# Patient Record
Sex: Female | Born: 1935 | Race: White | Hispanic: No | Marital: Married | Smoking: Never smoker
Health system: Southern US, Community
[De-identification: ages and names within clinical notes are randomized; demographics above are authoritative.]

## PROBLEM LIST (undated history)

## (undated) DIAGNOSIS — M329 Systemic lupus erythematosus, unspecified: Secondary | ICD-10-CM

## (undated) DIAGNOSIS — I10 Essential (primary) hypertension: Secondary | ICD-10-CM

## (undated) DIAGNOSIS — E785 Hyperlipidemia, unspecified: Secondary | ICD-10-CM

## (undated) DIAGNOSIS — I82409 Acute embolism and thrombosis of unspecified deep veins of unspecified lower extremity: Secondary | ICD-10-CM

## (undated) HISTORY — PX: CHOLECYSTECTOMY: SHX55

## (undated) HISTORY — PX: ABDOMINAL HYSTERECTOMY: SHX81

---

## 1999-06-19 ENCOUNTER — Encounter: Admission: RE | Admit: 1999-06-19 | Discharge: 1999-06-19 | Payer: Self-pay | Admitting: Orthopedic Surgery

## 1999-06-19 ENCOUNTER — Encounter: Payer: Self-pay | Admitting: Orthopedic Surgery

## 1999-06-23 ENCOUNTER — Encounter: Payer: Self-pay | Admitting: Orthopaedic Surgery

## 1999-06-23 ENCOUNTER — Encounter: Admission: RE | Admit: 1999-06-23 | Discharge: 1999-06-23 | Payer: Self-pay | Admitting: Orthopaedic Surgery

## 1999-06-24 ENCOUNTER — Ambulatory Visit (HOSPITAL_BASED_OUTPATIENT_CLINIC_OR_DEPARTMENT_OTHER): Admission: RE | Admit: 1999-06-24 | Discharge: 1999-06-24 | Payer: Self-pay | Admitting: Orthopaedic Surgery

## 1999-07-01 ENCOUNTER — Encounter: Admission: RE | Admit: 1999-07-01 | Discharge: 1999-07-01 | Payer: Self-pay | Admitting: Orthopaedic Surgery

## 1999-07-01 ENCOUNTER — Encounter: Payer: Self-pay | Admitting: Orthopaedic Surgery

## 1999-12-24 ENCOUNTER — Encounter: Admission: RE | Admit: 1999-12-24 | Discharge: 1999-12-24 | Payer: Self-pay | Admitting: Family Medicine

## 1999-12-24 ENCOUNTER — Encounter: Payer: Self-pay | Admitting: Family Medicine

## 2015-04-13 DIAGNOSIS — Z79899 Other long term (current) drug therapy: Secondary | ICD-10-CM | POA: Insufficient documentation

## 2015-04-13 DIAGNOSIS — M818 Other osteoporosis without current pathological fracture: Secondary | ICD-10-CM | POA: Insufficient documentation

## 2015-07-24 DIAGNOSIS — R059 Cough, unspecified: Secondary | ICD-10-CM | POA: Insufficient documentation

## 2015-08-28 DIAGNOSIS — R911 Solitary pulmonary nodule: Secondary | ICD-10-CM | POA: Insufficient documentation

## 2015-08-28 DIAGNOSIS — J0101 Acute recurrent maxillary sinusitis: Secondary | ICD-10-CM | POA: Insufficient documentation

## 2015-09-17 DIAGNOSIS — M7551 Bursitis of right shoulder: Secondary | ICD-10-CM | POA: Insufficient documentation

## 2015-09-17 DIAGNOSIS — M7061 Trochanteric bursitis, right hip: Secondary | ICD-10-CM | POA: Insufficient documentation

## 2016-04-04 DIAGNOSIS — I341 Nonrheumatic mitral (valve) prolapse: Secondary | ICD-10-CM

## 2016-04-04 DIAGNOSIS — E860 Dehydration: Secondary | ICD-10-CM | POA: Insufficient documentation

## 2016-04-04 HISTORY — DX: Nonrheumatic mitral (valve) prolapse: I34.1

## 2016-04-05 DIAGNOSIS — N179 Acute kidney failure, unspecified: Secondary | ICD-10-CM

## 2016-04-05 HISTORY — DX: Acute kidney failure, unspecified: N17.9

## 2016-05-12 DIAGNOSIS — J9601 Acute respiratory failure with hypoxia: Secondary | ICD-10-CM

## 2016-05-12 DIAGNOSIS — Z66 Do not resuscitate: Secondary | ICD-10-CM | POA: Insufficient documentation

## 2016-05-12 DIAGNOSIS — B349 Viral infection, unspecified: Secondary | ICD-10-CM | POA: Insufficient documentation

## 2016-05-12 DIAGNOSIS — B3789 Other sites of candidiasis: Secondary | ICD-10-CM | POA: Insufficient documentation

## 2016-05-12 DIAGNOSIS — K146 Glossodynia: Secondary | ICD-10-CM | POA: Insufficient documentation

## 2016-05-12 DIAGNOSIS — D849 Immunodeficiency, unspecified: Secondary | ICD-10-CM

## 2016-05-12 DIAGNOSIS — Z79899 Other long term (current) drug therapy: Secondary | ICD-10-CM | POA: Insufficient documentation

## 2016-05-12 DIAGNOSIS — Z79631 Long term (current) use of antimetabolite agent: Secondary | ICD-10-CM | POA: Insufficient documentation

## 2016-05-12 DIAGNOSIS — J101 Influenza due to other identified influenza virus with other respiratory manifestations: Secondary | ICD-10-CM | POA: Insufficient documentation

## 2016-05-12 HISTORY — DX: Acute respiratory failure with hypoxia: J96.01

## 2016-05-12 HISTORY — DX: Immunodeficiency, unspecified: D84.9

## 2016-11-02 DIAGNOSIS — L84 Corns and callosities: Secondary | ICD-10-CM | POA: Insufficient documentation

## 2016-11-02 DIAGNOSIS — L603 Nail dystrophy: Secondary | ICD-10-CM | POA: Insufficient documentation

## 2016-11-02 DIAGNOSIS — M2041 Other hammer toe(s) (acquired), right foot: Secondary | ICD-10-CM | POA: Insufficient documentation

## 2018-03-23 ENCOUNTER — Encounter (HOSPITAL_COMMUNITY): Payer: Self-pay | Admitting: Emergency Medicine

## 2018-03-23 ENCOUNTER — Emergency Department (HOSPITAL_COMMUNITY)
Admission: EM | Admit: 2018-03-23 | Discharge: 2018-03-23 | Disposition: A | Discharge status: Home / Self Care | Payer: Medicare Other | Attending: Emergency Medicine | Admitting: Emergency Medicine

## 2018-03-23 DIAGNOSIS — W19XXXA Unspecified fall, initial encounter: Secondary | ICD-10-CM | POA: Insufficient documentation

## 2018-03-23 DIAGNOSIS — D6489 Other specified anemias: Secondary | ICD-10-CM | POA: Diagnosis not present

## 2018-03-23 DIAGNOSIS — Z79899 Other long term (current) drug therapy: Secondary | ICD-10-CM | POA: Diagnosis not present

## 2018-03-23 DIAGNOSIS — Z7901 Long term (current) use of anticoagulants: Secondary | ICD-10-CM | POA: Diagnosis not present

## 2018-03-23 DIAGNOSIS — Z7982 Long term (current) use of aspirin: Secondary | ICD-10-CM | POA: Insufficient documentation

## 2018-03-23 DIAGNOSIS — I10 Essential (primary) hypertension: Secondary | ICD-10-CM | POA: Insufficient documentation

## 2018-03-23 DIAGNOSIS — S40012A Contusion of left shoulder, initial encounter: Secondary | ICD-10-CM | POA: Diagnosis not present

## 2018-03-23 DIAGNOSIS — R799 Abnormal finding of blood chemistry, unspecified: Secondary | ICD-10-CM | POA: Insufficient documentation

## 2018-03-23 DIAGNOSIS — T148XXA Other injury of unspecified body region, initial encounter: Secondary | ICD-10-CM

## 2018-03-23 DIAGNOSIS — Y929 Unspecified place or not applicable: Secondary | ICD-10-CM | POA: Insufficient documentation

## 2018-03-23 DIAGNOSIS — Y999 Unspecified external cause status: Secondary | ICD-10-CM | POA: Diagnosis not present

## 2018-03-23 DIAGNOSIS — Y939 Activity, unspecified: Secondary | ICD-10-CM | POA: Diagnosis not present

## 2018-03-23 DIAGNOSIS — S4992XA Unspecified injury of left shoulder and upper arm, initial encounter: Secondary | ICD-10-CM | POA: Diagnosis present

## 2018-03-23 HISTORY — DX: Acute embolism and thrombosis of unspecified deep veins of unspecified lower extremity: I82.409

## 2018-03-23 HISTORY — DX: Systemic lupus erythematosus, unspecified: M32.9

## 2018-03-23 HISTORY — DX: Essential (primary) hypertension: I10

## 2018-03-23 HISTORY — DX: Hyperlipidemia, unspecified: E78.5

## 2018-03-23 LAB — CBC
HEMATOCRIT: 28 % — AB (ref 36.0–46.0)
Hemoglobin: 8.4 g/dL — ABNORMAL LOW (ref 12.0–15.0)
MCH: 28.8 pg (ref 26.0–34.0)
MCHC: 30 g/dL (ref 30.0–36.0)
MCV: 95.9 fL (ref 80.0–100.0)
Platelets: 281 10*3/uL (ref 150–400)
RBC: 2.92 MIL/uL — ABNORMAL LOW (ref 3.87–5.11)
RDW: 16.3 % — ABNORMAL HIGH (ref 11.5–15.5)
WBC: 9.2 10*3/uL (ref 4.0–10.5)
nRBC: 0 % (ref 0.0–0.2)

## 2018-03-23 LAB — COMPREHENSIVE METABOLIC PANEL
ALT: 18 U/L (ref 0–44)
AST: 25 U/L (ref 15–41)
Albumin: 3.7 g/dL (ref 3.5–5.0)
Alkaline Phosphatase: 40 U/L (ref 38–126)
Anion gap: 9 (ref 5–15)
BUN: 29 mg/dL — AB (ref 8–23)
CO2: 25 mmol/L (ref 22–32)
Calcium: 9.1 mg/dL (ref 8.9–10.3)
Chloride: 108 mmol/L (ref 98–111)
Creatinine, Ser: 1.02 mg/dL — ABNORMAL HIGH (ref 0.44–1.00)
GFR calc Af Amer: 56 mL/min — ABNORMAL LOW (ref >60)
GFR calc non Af Amer: 49 mL/min — ABNORMAL LOW (ref >60)
Glucose, Bld: 100 mg/dL — ABNORMAL HIGH (ref 70–99)
Potassium: 3.9 mmol/L (ref 3.5–5.1)
Sodium: 142 mmol/L (ref 135–145)
Total Bilirubin: 1 mg/dL (ref 0.3–1.2)
Total Protein: 6.3 g/dL — ABNORMAL LOW (ref 6.5–8.1)

## 2018-03-23 LAB — POC OCCULT BLOOD, ED: Fecal Occult Bld: NEGATIVE

## 2018-03-23 NOTE — Discharge Instructions (Addendum)
1.  Increase your intake of foods containing high iron.  You may take an over-the-counter iron supplement. 2.  Elevate your arm as much as possible. 3.  Return to the emergency department if you are feeling lightheaded, increasing shortness of breath, signs of rebleeding or other concerning symptoms.

## 2018-03-23 NOTE — ED Provider Notes (Signed)
[facility] DEPT Provider Note   CSN: [id number] Arrival date & time: 03/23/18  1459     History   Chief Complaint Chief Complaint  Patient presents with  . low HGB    HPI Renee Alvarado is a 85 y.o. female.  HPI Patient is chronically on Eliquis for history of DVT.  She fell last week and bruised her left anterior shoulder.  She reports she had a large bruised area and this continued to expand such that she got a lot of bruising and discoloration in her breasts as well as her arm.  She reports she did follow-up with orthopedics and had x-rays done.  Nothing was broken.  She reports that the pain is still in the shoulder and despite all of the discoloration of her breast and arm, there is no pain involved there.  She reports she was sent by her doctor because her hemoglobin had been 8.5 and then on recheck was 8.2.  Patient denies she is having syncope or near syncope episodes.  Patient has been taking Eliquis ongoing. Past Medical History:  Diagnosis Date  . DVT (deep venous thrombosis) ([place])   . Hyperlipidemia   . Hypertension   . Systemic lupus ([name])     There are no active problems to display for this patient.   Past Surgical History:  Procedure Laterality Date  . ABDOMINAL HYSTERECTOMY    . CHOLECYSTECTOMY       OB History   No obstetric history on file.      Home Medications    Prior to Admission medications   Medication Sig Start Date End Date Taking? Authorizing Provider  apixaban (ELIQUIS) 5 MG TABS tablet Take 5 mg by mouth 2 (two) times daily.   Yes [provider]  aspirin EC 81 MG tablet Take 81 mg by mouth daily.   Yes [provider]  CALCIUM PO Take 1,200 mg by mouth daily.   Yes [provider]  clotrimazole-betamethasone (LOTRISONE) cream Apply 1 application topically 2 (two) times daily.   Yes [provider]  Cyanocobalamin (VITAMIN B-12 IJ) Inject 1,000 mcg as directed every 30  (thirty) days.   Yes [provider]  esomeprazole (NEXIUM) 20 MG capsule Take 20 mg by mouth daily at 12 noon.   Yes [provider]  folic acid (FOLVITE) 1 MG tablet Take 1 mg by mouth 2 (two) times daily.   Yes [provider]  Magnesium 250 MG TABS Take 1 tablet by mouth daily.   Yes [provider]  methotrexate (RHEUMATREX) 5 MG tablet Take 5 mg by mouth once a week. Caution: Chemotherapy. Protect from light.   Yes [provider]  oxybutynin (DITROPAN) 5 MG tablet Take 5 mg by mouth 2 (two) times daily.   Yes [provider]  predniSONE (DELTASONE) 10 MG tablet Take 10 mg by mouth daily with breakfast.   Yes [provider]  propranolol (INDERAL) 10 MG tablet Take 10 mg by mouth 4 (four) times daily.   Yes [provider]  sertraline (ZOLOFT) 50 MG tablet Take 50 mg by mouth daily.   Yes [provider]    Family History No family history on file.  Social History Social History   Tobacco Use  . Smoking status: Never Smoker  . Smokeless tobacco: Never Used  Substance Use Topics  . Alcohol use: Not on file  . Drug use: Not on file     Allergies  Levaquin [levofloxacin]   Review of Systems Review of Systems 10 Systems reviewed and are negative for acute change except as noted in the HPI.   Physical Exam Updated Vital Signs BP 102/88   Pulse 62   Temp 98.4 F (36.9 C) (Oral)   Resp 19   SpO2 98%   Physical Exam Constitutional:      Comments: Patient is alert and well in appearance.  No respiratory distress.  Color good  HENT:     Head: Normocephalic and atraumatic.  Eyes:     Extraocular Movements: Extraocular movements intact.  Cardiovascular:     Rate and Rhythm: Normal rate and regular rhythm.  Pulmonary:     Effort: Pulmonary effort is normal.     Breath sounds: Normal breath sounds.  Chest:     Chest wall: No tenderness.  Abdominal:     General: There is no distension.       Palpations: Abdomen is soft.     Tenderness: There is no abdominal tenderness. There is no guarding.  Musculoskeletal:     Comments: Patient still has pain with range of motion at the left shoulder.  She has discoloration from tachycardia hematoma involving all of the lower aspect of the left arm.  She is however neurovascularly intact with good pulse and good grip.  Sensation normal.  Patient has extensive tracking hematoma in the left breast which is nontender.  Skin:    General: Skin is warm and dry.  Neurological:     General: No focal deficit present.     Coordination: Coordination normal.  Psychiatric:        Mood and Affect: Mood normal.          ED Treatments / Results  Labs (all labs ordered are listed, but only abnormal results are displayed) Labs Reviewed  COMPREHENSIVE METABOLIC PANEL - Abnormal; Notable for the following components:      Result Value   Glucose, Bld 100 (*)    BUN 29 (*)    Creatinine, Ser 1.02 (*)    Total Protein 6.3 (*)    GFR calc non Af Amer 49 (*)    GFR calc Af Amer 56 (*)    All other components within normal limits  CBC - Abnormal; Notable for the following components:   RBC 2.92 (*)    Hemoglobin 8.4 (*)    HCT 28.0 (*)    RDW 16.3 (*)    All other components within normal limits  POC OCCULT BLOOD, ED  TYPE AND SCREEN    EKG None  Radiology No results found.  Procedures Procedures (including critical care time)  Medications Ordered in ED Medications - No data to display   Initial Impression / Assessment and Plan / ED Course  I have reviewed the triage vital signs and the nursing notes.  Pertinent labs & imaging results that were available during my care of the patient were reviewed by me and considered in my medical decision making (see chart for details).    Patient was referred to the emergency department for evaluation of anemia.  This appears stable.  Patient's injury is a week old.  At this time no signs of  ongoing blood loss.  There is no warmth or ongoing swelling.  Patient does not have symptomatic anemia that at this time would necessitate transfusion.  Patient is counseled on increasing iron intake.  Counseled on elevating the arm.  At this time, patient had been taking her Eliquis continuously.  As she has been taking it continuously and the there is no signs of ongoing bleeding, I feel the risk-benefit is in favor of her continuing the Eliquis rather than risk of DVT which she has had several last most recently within this year.  If there is any signs of rebleeding or ongoing bleeding can discontinue the Eliquis.  Final Clinical Impressions(s) / ED Diagnoses   Final diagnoses:  Anemia due to other cause, not classified  Hematoma    ED Discharge Orders    None       Charlesetta Shanks, MD 03/23/18 2000

## 2018-03-23 NOTE — ED Notes (Signed)
Patient able to ambulate to restroom with assistance.

## 2018-03-23 NOTE — ED Triage Notes (Signed)
Pt reports that she fell last week and has bruising on left arm from shoulder to fingers. Pt is on Eliquis. Pt sent from PCP due to Hgb 8.2 from 8.5 on Wed when was seen there.

## 2018-03-24 LAB — TYPE AND SCREEN
ABO/RH(D): O POS
Antibody Screen: POSITIVE

## 2019-10-06 ENCOUNTER — Inpatient Hospital Stay (HOSPITAL_COMMUNITY)
Admission: EM | Admit: 2019-10-06 | Discharge: 2019-10-10 | DRG: 195 | Disposition: A | Discharge status: Home Health | Payer: Medicare Other | Attending: Family Medicine | Admitting: Family Medicine

## 2019-10-06 ENCOUNTER — Encounter (HOSPITAL_COMMUNITY): Payer: Self-pay

## 2019-10-06 ENCOUNTER — Emergency Department (HOSPITAL_COMMUNITY): Payer: Medicare Other

## 2019-10-06 DIAGNOSIS — Y92009 Unspecified place in unspecified non-institutional (private) residence as the place of occurrence of the external cause: Secondary | ICD-10-CM

## 2019-10-06 DIAGNOSIS — Z7901 Long term (current) use of anticoagulants: Secondary | ICD-10-CM

## 2019-10-06 DIAGNOSIS — I10 Essential (primary) hypertension: Secondary | ICD-10-CM | POA: Diagnosis present

## 2019-10-06 DIAGNOSIS — W1830XA Fall on same level, unspecified, initial encounter: Secondary | ICD-10-CM | POA: Diagnosis present

## 2019-10-06 DIAGNOSIS — Z20822 Contact with and (suspected) exposure to covid-19: Secondary | ICD-10-CM | POA: Diagnosis present

## 2019-10-06 DIAGNOSIS — D649 Anemia, unspecified: Secondary | ICD-10-CM | POA: Diagnosis present

## 2019-10-06 DIAGNOSIS — I440 Atrioventricular block, first degree: Secondary | ICD-10-CM | POA: Diagnosis present

## 2019-10-06 DIAGNOSIS — Z66 Do not resuscitate: Secondary | ICD-10-CM | POA: Diagnosis present

## 2019-10-06 DIAGNOSIS — M810 Age-related osteoporosis without current pathological fracture: Secondary | ICD-10-CM | POA: Diagnosis present

## 2019-10-06 DIAGNOSIS — J189 Pneumonia, unspecified organism: Secondary | ICD-10-CM | POA: Diagnosis not present

## 2019-10-06 DIAGNOSIS — K219 Gastro-esophageal reflux disease without esophagitis: Secondary | ICD-10-CM | POA: Diagnosis present

## 2019-10-06 DIAGNOSIS — Z7952 Long term (current) use of systemic steroids: Secondary | ICD-10-CM

## 2019-10-06 DIAGNOSIS — Z7982 Long term (current) use of aspirin: Secondary | ICD-10-CM

## 2019-10-06 DIAGNOSIS — R0902 Hypoxemia: Secondary | ICD-10-CM | POA: Diagnosis present

## 2019-10-06 DIAGNOSIS — R296 Repeated falls: Secondary | ICD-10-CM | POA: Diagnosis present

## 2019-10-06 DIAGNOSIS — N1831 Chronic kidney disease, stage 3a: Secondary | ICD-10-CM | POA: Diagnosis present

## 2019-10-06 DIAGNOSIS — I129 Hypertensive chronic kidney disease with stage 1 through stage 4 chronic kidney disease, or unspecified chronic kidney disease: Secondary | ICD-10-CM | POA: Diagnosis present

## 2019-10-06 DIAGNOSIS — Z86718 Personal history of other venous thrombosis and embolism: Secondary | ICD-10-CM

## 2019-10-06 DIAGNOSIS — M329 Systemic lupus erythematosus, unspecified: Secondary | ICD-10-CM | POA: Diagnosis present

## 2019-10-06 DIAGNOSIS — M353 Polymyalgia rheumatica: Secondary | ICD-10-CM | POA: Diagnosis present

## 2019-10-06 DIAGNOSIS — S0003XA Contusion of scalp, initial encounter: Secondary | ICD-10-CM | POA: Diagnosis present

## 2019-10-06 DIAGNOSIS — Z79899 Other long term (current) drug therapy: Secondary | ICD-10-CM

## 2019-10-06 DIAGNOSIS — H1033 Unspecified acute conjunctivitis, bilateral: Secondary | ICD-10-CM | POA: Diagnosis present

## 2019-10-06 DIAGNOSIS — Z85820 Personal history of malignant melanoma of skin: Secondary | ICD-10-CM

## 2019-10-06 DIAGNOSIS — C4339 Malignant melanoma of other parts of face: Secondary | ICD-10-CM | POA: Diagnosis present

## 2019-10-06 LAB — BRAIN NATRIURETIC PEPTIDE: B Natriuretic Peptide: 217.1 pg/mL — ABNORMAL HIGH (ref 0.0–100.0)

## 2019-10-06 LAB — COMPREHENSIVE METABOLIC PANEL
ALT: 16 U/L (ref 0–44)
AST: 18 U/L (ref 15–41)
Albumin: 2.9 g/dL — ABNORMAL LOW (ref 3.5–5.0)
Alkaline Phosphatase: 52 U/L (ref 38–126)
Anion gap: 9 (ref 5–15)
BUN: 24 mg/dL — ABNORMAL HIGH (ref 8–23)
CO2: 24 mmol/L (ref 22–32)
Calcium: 8.8 mg/dL — ABNORMAL LOW (ref 8.9–10.3)
Chloride: 106 mmol/L (ref 98–111)
Creatinine, Ser: 1.14 mg/dL — ABNORMAL HIGH (ref 0.44–1.00)
GFR calc Af Amer: 49 mL/min — ABNORMAL LOW (ref >60)
GFR calc non Af Amer: 42 mL/min — ABNORMAL LOW (ref >60)
Glucose, Bld: 117 mg/dL — ABNORMAL HIGH (ref 70–99)
Potassium: 3.7 mmol/L (ref 3.5–5.1)
Sodium: 139 mmol/L (ref 135–145)
Total Bilirubin: 0.8 mg/dL (ref 0.3–1.2)
Total Protein: 5.7 g/dL — ABNORMAL LOW (ref 6.5–8.1)

## 2019-10-06 LAB — URINALYSIS, ROUTINE W REFLEX MICROSCOPIC
Bilirubin Urine: NEGATIVE
Glucose, UA: NEGATIVE mg/dL
Hgb urine dipstick: NEGATIVE
Ketones, ur: NEGATIVE mg/dL
Nitrite: NEGATIVE
Protein, ur: NEGATIVE mg/dL
Specific Gravity, Urine: 1.008 (ref 1.005–1.030)
pH: 7 (ref 5.0–8.0)

## 2019-10-06 LAB — ETHANOL: Alcohol, Ethyl (B): <10 mg/dL (ref <10)

## 2019-10-06 LAB — TROPONIN I (HIGH SENSITIVITY): Troponin I (High Sensitivity): 20 ng/L — ABNORMAL HIGH (ref <18)

## 2019-10-06 LAB — I-STAT CHEM 8, ED
BUN: 23 mg/dL (ref 8–23)
Calcium, Ion: 1.14 mmol/L — ABNORMAL LOW (ref 1.15–1.40)
Chloride: 105 mmol/L (ref 98–111)
Creatinine, Ser: 1.1 mg/dL — ABNORMAL HIGH (ref 0.44–1.00)
Glucose, Bld: 109 mg/dL — ABNORMAL HIGH (ref 70–99)
HCT: 28 % — ABNORMAL LOW (ref 36.0–46.0)
Hemoglobin: 9.5 g/dL — ABNORMAL LOW (ref 12.0–15.0)
Potassium: 3.7 mmol/L (ref 3.5–5.1)
Sodium: 139 mmol/L (ref 135–145)
TCO2: 23 mmol/L (ref 22–32)

## 2019-10-06 LAB — PROTIME-INR
INR: 1.3 — ABNORMAL HIGH (ref 0.8–1.2)
Prothrombin Time: 15.6 seconds — ABNORMAL HIGH (ref 11.4–15.2)

## 2019-10-06 LAB — CBC
HCT: 33.6 % — ABNORMAL LOW (ref 36.0–46.0)
Hemoglobin: 10.6 g/dL — ABNORMAL LOW (ref 12.0–15.0)
MCH: 29.2 pg (ref 26.0–34.0)
MCHC: 31.5 g/dL (ref 30.0–36.0)
MCV: 92.6 fL (ref 80.0–100.0)
Platelets: 220 10*3/uL (ref 150–400)
RBC: 3.63 MIL/uL — ABNORMAL LOW (ref 3.87–5.11)
RDW: 15.7 % — ABNORMAL HIGH (ref 11.5–15.5)
WBC: 10.3 10*3/uL (ref 4.0–10.5)
nRBC: 0 % (ref 0.0–0.2)

## 2019-10-06 LAB — SARS CORONAVIRUS 2 BY RT PCR (HOSPITAL ORDER, PERFORMED IN ~~LOC~~ HOSPITAL LAB): SARS Coronavirus 2: NEGATIVE

## 2019-10-06 LAB — LACTIC ACID, PLASMA: Lactic Acid, Venous: 1.3 mmol/L (ref 0.5–1.9)

## 2019-10-06 MED ORDER — LACTATED RINGERS IV BOLUS
1000.0000 mL | Freq: Once | INTRAVENOUS | Status: AC
  Administered 2019-10-06: 1000 mL via INTRAVENOUS

## 2019-10-06 MED ORDER — SODIUM CHLORIDE 0.9 % IV SOLN
500.0000 mg | INTRAVENOUS | Status: DC
  Administered 2019-10-06 – 2019-10-07 (×2): 500 mg via INTRAVENOUS
  Filled 2019-10-06 (×3): qty 500

## 2019-10-06 MED ORDER — SODIUM CHLORIDE 0.9 % IV SOLN
2.0000 g | INTRAVENOUS | Status: DC
  Administered 2019-10-06: 2 g via INTRAVENOUS
  Filled 2019-10-06: qty 20

## 2019-10-06 NOTE — ED Notes (Signed)
Pt to xray and CT. Caregiver arrived, understands pt has a fever and is being ruled out for COVID.

## 2019-10-06 NOTE — ED Provider Notes (Signed)
[facility] EMERGENCY DEPARTMENT Provider Note   CSN: [id number] Arrival date & time: 10/06/19  1846     History Chief Complaint  Patient presents with  . Fall    Renee Alvarado is a 86 y.o. female with medical history significant for "blood clots" on Eliquis, frequent UTIs, frequent falls presents the ED as a level 2 trauma from the field. Reportedly was standing in the kitchen when she had an unwitnessed fall. Patient states that she is not sure if she lost consciousness or tripped but states that she "falls all the time". EMS called on arrival patient fully oriented but in route multiple times became confused and disoriented, also noted to have some anisocoria with left pupil 6 mm, right pupil 4 mm.  On arrival, patient noted to be fully oriented, left pupil 4 mm, right pupil 2 mm, quickly improving to 3 mm bilaterally. Noted to be febrile.  The history is provided by the patient and the EMS personnel.  Trauma Mechanism of injury: fall Injury location: head/neck Injury location detail: head Incident location: home Time since incident: 30 minutes Arrived directly from scene: yes   Fall:      Fall occurred: standing      Impact surface: hard floor      Point of impact: head  EMS/PTA data:      Ambulatory at scene: no      Blood loss: minimal      Responsiveness at scene: Waxing and waning since fall.      Oriented to: person, place, situation and time  Current symptoms:      Associated symptoms:            Denies abdominal pain, chest pain, headache and vomiting.       History reviewed. No pertinent past medical history.  Patient Active Problem List   Diagnosis Date Noted  . Pneumonia of both lungs due to infectious organism 10/06/2019    History reviewed. No pertinent surgical history.   OB History   No obstetric history on file.     No family history on file.  Social History   Tobacco Use  . Smoking status: Not on file  Substance Use  Topics  . Alcohol use: Not on file  . Drug use: Not on file    Home Medications Prior to Admission medications   Medication Sig Start Date End Date Taking? Authorizing Provider  acetaminophen (TYLENOL) 325 MG tablet Take 650 mg by mouth every 6 (six) hours as needed for headache (pain).   Yes [provider]  apixaban (ELIQUIS) 5 MG TABS tablet Take 5 mg by mouth 2 (two) times daily.   Yes [provider]  aspirin EC 81 MG tablet Take 81 mg by mouth daily. Swallow whole.   Yes [provider]  Calcium Carb-Cholecalciferol (CALCIUM 600-D PO) Take 1 tablet by mouth 2 (two) times daily.   Yes [provider]  esomeprazole (NEXIUM) 20 MG capsule Take 20 mg by mouth daily at 12 noon.   Yes [provider]  folic acid (FOLVITE) 1 MG tablet Take 1 mg by mouth 2 (two) times daily.   Yes [provider]  Magnesium 250 MG TABS Take 250 mg by mouth daily.   Yes [provider]  methotrexate (RHEUMATREX) 2.5 MG tablet Take 10 mg by mouth every Sunday. Caution:Chemotherapy. Protect from light.   Yes [provider]  Multiple Vitamin (MULTIVITAMIN WITH MINERALS) TABS tablet Take 1 tablet by mouth  daily.   Yes [provider]  oxybutynin (DITROPAN) 5 MG tablet Take 5 mg by mouth 2 (two) times daily.   Yes [provider]  predniSONE (DELTASONE) 5 MG tablet Take 10 mg by mouth daily with breakfast.   Yes [provider]  propranolol (INDERAL) 10 MG tablet Take 10 mg by mouth 3 (three) times daily.   Yes [provider]  sertraline (ZOLOFT) 50 MG tablet Take 50 mg by mouth daily.   Yes [provider]    Allergies    Patient has no known allergies.  Review of Systems   Review of Systems  Constitutional: Positive for chills and fatigue.  HENT: Negative for facial swelling and voice change.   Eyes: Negative for redness and visual disturbance.  Respiratory: Negative for cough and shortness  of breath.   Cardiovascular: Negative for chest pain and palpitations.  Gastrointestinal: Negative for abdominal pain and vomiting.  Genitourinary: Negative for difficulty urinating and dysuria.  Musculoskeletal: Negative for gait problem and joint swelling.  Skin: Negative for rash and wound.  Neurological: Positive for weakness. Negative for dizziness and headaches.  Psychiatric/Behavioral: Negative for confusion and suicidal ideas.    Physical Exam Updated Vital Signs BP (!) 155/53   Pulse 77   Temp (!) 102 F (38.9 C) (Rectal)   Resp 19   Ht 5\' 4"  (1.626 m)   Wt 57.2 kg   SpO2 98%   BMI 21.63 kg/m   Physical Exam Constitutional:      General: She is not in acute distress.    Comments: Chronically ill-appearing  HENT:     Head: Normocephalic.     Comments: Healing hematoma and abrasion to the frontal scalp, mild hematoma to the occipital scalp.    Mouth/Throat:     Mouth: Mucous membranes are moist.     Pharynx: Oropharynx is clear.  Eyes:     General: No scleral icterus.    Pupils: Pupils are equal, round, and reactive to light.  Cardiovascular:     Rate and Rhythm: Normal rate and regular rhythm.     Pulses: Normal pulses.  Pulmonary:     Effort: Pulmonary effort is normal. No respiratory distress.  Abdominal:     General: There is no distension.     Tenderness: There is no abdominal tenderness.  Musculoskeletal:        General: No tenderness or deformity.     Cervical back: Normal range of motion and neck supple. No tenderness.     Comments: No midline spinal tenderness  Neurological:     General: No focal deficit present.     Mental Status: She is alert and oriented to person, place, and time.  Psychiatric:        Mood and Affect: Mood normal.        Behavior: Behavior normal.     ED Results / Procedures / Treatments   Labs (all labs ordered are listed, but only abnormal results are displayed) Labs Reviewed  COMPREHENSIVE METABOLIC PANEL - Abnormal;  Notable for the following components:      Result Value   Glucose, Bld 117 (*)    BUN 24 (*)    Creatinine, Ser 1.14 (*)    Calcium 8.8 (*)    Total Protein 5.7 (*)    Albumin 2.9 (*)    GFR calc non Af Amer 42 (*)    GFR calc Af Amer 49 (*)    All other components within normal limits  CBC - Abnormal; Notable for the following components:   RBC 3.63 (*)    Hemoglobin 10.6 (*)    HCT 33.6 (*)    RDW 15.7 (*)    All other components within normal limits  URINALYSIS, ROUTINE W REFLEX MICROSCOPIC - Abnormal; Notable for the following components:   Leukocytes,Ua TRACE (*)    Bacteria, UA RARE (*)    All other components within normal limits  PROTIME-INR - Abnormal; Notable for the following components:   Prothrombin Time 15.6 (*)    INR 1.3 (*)    All other components within normal limits  BRAIN NATRIURETIC PEPTIDE - Abnormal; Notable for the following components:   B Natriuretic Peptide 217.1 (*)    All other components within normal limits  I-STAT CHEM 8, ED - Abnormal; Notable for the following components:   Creatinine, Ser 1.10 (*)    Glucose, Bld 109 (*)    Calcium, Ion 1.14 (*)    Hemoglobin 9.5 (*)    HCT 28.0 (*)    All other components within normal limits  TROPONIN I (HIGH SENSITIVITY) - Abnormal; Notable for the following components:   Troponin I (High Sensitivity) 20 (*)    All other components within normal limits  SARS CORONAVIRUS 2 BY RT PCR (HOSPITAL ORDER, [facility])  CULTURE, BLOOD (ROUTINE X 2)  CULTURE, BLOOD (ROUTINE X 2)  URINE CULTURE  CULTURE, BLOOD (ROUTINE X 2) W REFLEX TO ID PANEL  ETHANOL  LACTIC ACID, PLASMA  SAMPLE TO BLOOD BANK  TROPONIN I (HIGH SENSITIVITY)    EKG EKG Interpretation  Date/Time:  Sunday October 06 2019 18:52:02 EDT Ventricular Rate:  79 PR Interval:    QRS Duration: 124 QT Interval:  392 QTC Calculation: 450 R Axis:   -118 Text Interpretation: Sinus rhythm Prolonged PR interval  Nonspecific IVCD with LAD Inferior infarct, old Abnormal lateral Q waves Probable anteroseptal infarct, recent No previous ECGs available Confirmed by Gerlene Fee [phone]) on 10/06/2019 9:50:52 PM   Radiology CT Head Wo Contrast  Result Date: 10/06/2019 CLINICAL DATA:  Status post trauma. EXAM: CT HEAD WITHOUT CONTRAST TECHNIQUE: Contiguous axial images were obtained from the base of the skull through the vertex without intravenous contrast. COMPARISON:  October 04, 2018 FINDINGS: Brain: There is mild cerebral atrophy with widening of the extra-axial spaces and ventricular dilatation. There are areas of decreased attenuation within the white matter tracts of the supratentorial brain, consistent with microvascular disease changes. Vascular: No hyperdense vessel or unexpected calcification. Skull: Normal. Negative for fracture or focal lesion. Sinuses/Orbits: No acute finding. Other: Mild left frontal parietal scalp soft tissue swelling is seen. IMPRESSION: Mild left frontal parietal scalp soft tissue swelling, without evidence of acute fracture or acute intracranial abnormality. Electronically Signed   By: Virgina Norfolk M.D.   On: 10/06/2019 20:15   CT Cervical Spine Wo Contrast  Result Date: 10/06/2019 CLINICAL DATA:  Facial trauma, posterior scalp hematoma, fell, anticoagulated EXAM: CT CERVICAL SPINE WITHOUT CONTRAST TECHNIQUE: Multidetector CT imaging of the cervical spine was performed without intravenous contrast. Multiplanar CT image reconstructions were also generated. COMPARISON:  08/18/2017 FINDINGS: Alignment: There is right convex scoliosis of the cervical spine. Mild anterolisthesis of C4 on C5 is noted, likely due to extensive spondylosis and facet hypertrophy. Skull base and vertebrae: There are no acute displaced cervical spine fractures. Soft tissues and spinal canal: No prevertebral fluid or swelling. No visible canal hematoma. Disc levels: Severe hypertrophic changes are seen at the  C1/C2 interface, right greater than left. There is bony fusion across the facet joints from C2 through C4. Severe facet hypertrophy is noted at C4/C5 and C5/C6. The left C6/C7 facets are fused. There is extensive multilevel spondylosis, most pronounced at the C4/C5 level. There is bilateral neural foraminal encroachment at C4/C5 lateralizing to the right, and symmetrically at C5/C6. Left predominant neural foraminal narrowing is seen at C6/C7. Upper chest: Airway is patent. Biapical pleural and parenchymal scarring, as well as calcified pleural plaques, are noted. Other: Reconstructed images demonstrate no additional findings. IMPRESSION: 1. No evidence of cervical spine fracture. 2. Extensive multilevel cervical degenerative changes as above. Electronically Signed   By: Randa Ngo M.D.   On: 10/06/2019 20:16   DG Pelvis Portable  Result Date: 10/06/2019 CLINICAL DATA:  86 year old post fall. EXAM: PORTABLE PELVIS 1-2 VIEWS COMPARISON:  None. FINDINGS: The cortical margins of the bony pelvis are intact. No fracture. Pubic symphysis and sacroiliac joints are congruent. Both femoral heads are well-seated in the respective acetabula. Degenerative change of the sacroiliac joints, hips, and pubic symphysis. Advanced vascular calcifications. IMPRESSION: No pelvic fracture. Electronically Signed   By: Keith Rake M.D.   On: 10/06/2019 19:26   DG Chest Port 1 View  Result Date: 10/06/2019 CLINICAL DATA:  86 year old post fall at home. EXAM: PORTABLE CHEST 1 VIEW COMPARISON:  Radiograph 10/04/2018 FINDINGS: Stable heart size and mediastinal contours. Aortic atherosclerosis. No pneumothorax. There is biapical pleuroparenchymal scarring. Ill-defined opacities in the periphery of the left greater than right mid lung. No significant pleural effusion. Chronic change of the left shoulder. No acute osseous abnormalities are seen. IMPRESSION: Ill-defined opacities in the periphery of the left greater than right mid  lung, indeterminate for pulmonary contusion versus infection in the setting of fall. Aortic Atherosclerosis (ICD10-I70.0). Electronically Signed   By: Keith Rake M.D.   On: 10/06/2019 19:25   DG Shoulder Left  Result Date: 10/06/2019 CLINICAL DATA:  Left shoulder pain. EXAM: LEFT SHOULDER - 2+ VIEW COMPARISON:  June 06, 2019 (1:35 p.m.) FINDINGS: There is no evidence of an acute fracture or dislocation. Mild degenerative changes seen involving the left acromioclavicular joint and left glenohumeral articulation. Moderate severity chronic changes are noted along the inferior aspect of the left glenoid. An 8 mm well-defined area of soft tissue calcification is seen just below the left glenoid. This is seen on the prior study. Mild soft tissue swelling is seen above the left acromioclavicular joint. IMPRESSION: 1. Degenerative changes, as described above, without evidence of acute fracture or dislocation. 2. Soft tissue swelling above the left acromioclavicular joint. Electronically Signed   By: Virgina Norfolk M.D.   On: 10/06/2019 19:55    Procedures Procedures (including critical care time)  Medications Ordered in ED Medications  cefTRIAXone (ROCEPHIN) 2 g in sodium chloride 0.9 % 100 mL IVPB (0 g Intravenous Stopped 10/06/19 2154)  azithromycin (ZITHROMAX) 500 mg in sodium chloride 0.9 % 250 mL IVPB (0 mg Intravenous Stopped 10/06/19 2221)  lactated ringers bolus 1,000 mL (0 mLs Intravenous Stopped 10/06/19 2229)    ED Course  I have reviewed the triage vital signs and the nursing notes.  Pertinent labs & imaging results that were available during my care of the patient were reviewed by me and considered in my medical decision making (see chart for details).    MDM Rules/Calculators/A&P  EKG findings by my read: Compared to prior: None.  Rate: 79 rhythm: sinus Axis: appropriate  PR: 251 QRS: 144 QTc: 450.  Slightly widened QRS consistent with partial left  bundle branch block pattern, and long PR interval, otherwise no evidence of ischemia or arrhythmia, nor any other pathologic findings concerning considering patient presentation. Findings discussed with attending who agrees.  Possible differential diagnoses I considered included:  ICH, skull fracture, concussion, C-spine injury, thoracic trauma, abdominal trauma, spinal injury, fracture, neurovascular injury, laceration  Workup/Thought Process:  Patient presents following fall due to uncertain mechanism, presumed syncope in the setting of fever, no other infectious symptoms reported  Primary survey performed with findings above  Secondary survey performed with findings above  Vitals, labs, EKGs, and imaging were reviewed by myself, and were significant for: Vitals:   10/06/19 2215 10/06/19 2230  BP: (!) 164/54 (!) 155/53  Pulse: 71 77  Resp: (!) 21 19  Temp:    SpO2: 99% 98%    Trauma scans including CT head, CT C-spine indicated, significant for: No ICH or C-spine injury identified, c-collar cleared clinically.  Plain films of left shoulder, chest, pelvis significant for: No traumatic injury, questionable pneumonia in the left greater than right base  Trauma labs including CBC, CMP, urinalysis, lactic acid, along with cultures indicated. Findings include: WBC borderline elevated 10.3, chemistries unremarkable, urinalysis could be interpreted as infection with rare bacteria, trace leuk esterase though in the absence of symptoms favor pneumonia as cause of the patient's fever  Patient reassuring without any obvious traumatic injuries though with numerous frequent falls, fever as high as 102 and chest x-ray suggestive of pneumonia with waxing and waning mental status reported, I do have some concern for this patient's wellbeing at discharge, will antibiosis with Rocephin and azithromycin.  Hospital medicine consulted for admission, excepted the patient to their service.  The plan  for this patient was discussed with Dr. Sedonia Small who voiced agreement and who oversaw evaluation and treatment of this patient.   Final Clinical Impression(s) / ED Diagnoses Final diagnoses:  Fall at home  Community acquired pneumonia of left lower lobe of lung    Rx / DC Orders ED Discharge Orders    None     Labs, studies and imaging reviewed by myself and considered in medical decision making if ordered. Imaging interpreted by radiology. Pt was discussed with my attending, Dr. Sedonia Small  Electronically signed by:  Roderic Palau [place]8/29/202111:22 PM       Renold Genta, MD 10/06/19 8333    Maudie Flakes, MD 10/07/19 0000

## 2019-10-06 NOTE — ED Notes (Signed)
Pt is in a C-Collar placed by EMS

## 2019-10-06 NOTE — ED Triage Notes (Signed)
Pt came in [facility] Level II, post fall on thinners. Pt in on eliquis. EMS reports pt was initally A/Ox4 with a Left Pupil 6 and a 4 Right Pupil. Pt started having confusion in route, but now alert. EMS states pt has a hematoma to the back of the head and skin tear to the Left arm and elbow. EMS placed a 18G LAC. 123CBG

## 2019-10-07 ENCOUNTER — Observation Stay (HOSPITAL_COMMUNITY): Payer: Medicare Other

## 2019-10-07 ENCOUNTER — Encounter (HOSPITAL_COMMUNITY): Payer: Self-pay | Admitting: Internal Medicine

## 2019-10-07 DIAGNOSIS — W1830XA Fall on same level, unspecified, initial encounter: Secondary | ICD-10-CM | POA: Diagnosis present

## 2019-10-07 DIAGNOSIS — I10 Essential (primary) hypertension: Secondary | ICD-10-CM | POA: Diagnosis not present

## 2019-10-07 DIAGNOSIS — Z7982 Long term (current) use of aspirin: Secondary | ICD-10-CM | POA: Diagnosis not present

## 2019-10-07 DIAGNOSIS — M329 Systemic lupus erythematosus, unspecified: Secondary | ICD-10-CM | POA: Diagnosis present

## 2019-10-07 DIAGNOSIS — Z86718 Personal history of other venous thrombosis and embolism: Secondary | ICD-10-CM

## 2019-10-07 DIAGNOSIS — Z7952 Long term (current) use of systemic steroids: Secondary | ICD-10-CM | POA: Diagnosis not present

## 2019-10-07 DIAGNOSIS — S0003XA Contusion of scalp, initial encounter: Secondary | ICD-10-CM | POA: Diagnosis present

## 2019-10-07 DIAGNOSIS — Z7901 Long term (current) use of anticoagulants: Secondary | ICD-10-CM | POA: Diagnosis not present

## 2019-10-07 DIAGNOSIS — N1831 Chronic kidney disease, stage 3a: Secondary | ICD-10-CM | POA: Diagnosis present

## 2019-10-07 DIAGNOSIS — H1033 Unspecified acute conjunctivitis, bilateral: Secondary | ICD-10-CM | POA: Diagnosis present

## 2019-10-07 DIAGNOSIS — M353 Polymyalgia rheumatica: Secondary | ICD-10-CM

## 2019-10-07 DIAGNOSIS — R296 Repeated falls: Secondary | ICD-10-CM | POA: Diagnosis present

## 2019-10-07 DIAGNOSIS — D649 Anemia, unspecified: Secondary | ICD-10-CM | POA: Diagnosis present

## 2019-10-07 DIAGNOSIS — Z79899 Other long term (current) drug therapy: Secondary | ICD-10-CM | POA: Diagnosis not present

## 2019-10-07 DIAGNOSIS — R0902 Hypoxemia: Secondary | ICD-10-CM | POA: Diagnosis present

## 2019-10-07 DIAGNOSIS — K219 Gastro-esophageal reflux disease without esophagitis: Secondary | ICD-10-CM | POA: Diagnosis present

## 2019-10-07 DIAGNOSIS — Z20822 Contact with and (suspected) exposure to covid-19: Secondary | ICD-10-CM | POA: Diagnosis present

## 2019-10-07 DIAGNOSIS — C4339 Malignant melanoma of other parts of face: Secondary | ICD-10-CM | POA: Diagnosis present

## 2019-10-07 DIAGNOSIS — Z66 Do not resuscitate: Secondary | ICD-10-CM | POA: Diagnosis present

## 2019-10-07 DIAGNOSIS — Z85820 Personal history of malignant melanoma of skin: Secondary | ICD-10-CM | POA: Diagnosis not present

## 2019-10-07 DIAGNOSIS — I361 Nonrheumatic tricuspid (valve) insufficiency: Secondary | ICD-10-CM

## 2019-10-07 DIAGNOSIS — J189 Pneumonia, unspecified organism: Secondary | ICD-10-CM | POA: Diagnosis present

## 2019-10-07 DIAGNOSIS — M810 Age-related osteoporosis without current pathological fracture: Secondary | ICD-10-CM | POA: Diagnosis present

## 2019-10-07 DIAGNOSIS — Y92009 Unspecified place in unspecified non-institutional (private) residence as the place of occurrence of the external cause: Secondary | ICD-10-CM | POA: Diagnosis not present

## 2019-10-07 DIAGNOSIS — I129 Hypertensive chronic kidney disease with stage 1 through stage 4 chronic kidney disease, or unspecified chronic kidney disease: Secondary | ICD-10-CM | POA: Diagnosis present

## 2019-10-07 DIAGNOSIS — I440 Atrioventricular block, first degree: Secondary | ICD-10-CM | POA: Diagnosis present

## 2019-10-07 HISTORY — DX: Malignant melanoma of other parts of face: C43.39

## 2019-10-07 HISTORY — DX: Personal history of other venous thrombosis and embolism: Z86.718

## 2019-10-07 HISTORY — DX: Gastro-esophageal reflux disease without esophagitis: K21.9

## 2019-10-07 HISTORY — DX: Polymyalgia rheumatica: M35.3

## 2019-10-07 HISTORY — DX: Systemic lupus erythematosus, unspecified: M32.9

## 2019-10-07 HISTORY — DX: Essential (primary) hypertension: I10

## 2019-10-07 LAB — COMPREHENSIVE METABOLIC PANEL
ALT: 13 U/L (ref 0–44)
AST: 16 U/L (ref 15–41)
Albumin: 2.5 g/dL — ABNORMAL LOW (ref 3.5–5.0)
Alkaline Phosphatase: 50 U/L (ref 38–126)
Anion gap: 9 (ref 5–15)
BUN: 17 mg/dL (ref 8–23)
CO2: 23 mmol/L (ref 22–32)
Calcium: 8.3 mg/dL — ABNORMAL LOW (ref 8.9–10.3)
Chloride: 108 mmol/L (ref 98–111)
Creatinine, Ser: 1.05 mg/dL — ABNORMAL HIGH (ref 0.44–1.00)
GFR calc Af Amer: 54 mL/min — ABNORMAL LOW (ref >60)
GFR calc non Af Amer: 47 mL/min — ABNORMAL LOW (ref >60)
Glucose, Bld: 111 mg/dL — ABNORMAL HIGH (ref 70–99)
Potassium: 3.8 mmol/L (ref 3.5–5.1)
Sodium: 140 mmol/L (ref 135–145)
Total Bilirubin: 0.9 mg/dL (ref 0.3–1.2)
Total Protein: 4.7 g/dL — ABNORMAL LOW (ref 6.5–8.1)

## 2019-10-07 LAB — SAMPLE TO BLOOD BANK

## 2019-10-07 LAB — URINE CULTURE

## 2019-10-07 LAB — ECHOCARDIOGRAM COMPLETE
Area-P 1/2: 4.89 cm2
Height: 64 in
S' Lateral: 2.65 cm
Weight: 2016 oz

## 2019-10-07 LAB — HIV ANTIBODY (ROUTINE TESTING W REFLEX): HIV Screen 4th Generation wRfx: NONREACTIVE

## 2019-10-07 LAB — TROPONIN I (HIGH SENSITIVITY): Troponin I (High Sensitivity): 22 ng/L — ABNORMAL HIGH (ref <18)

## 2019-10-07 MED ORDER — PREDNISONE 10 MG PO TABS
10.0000 mg | ORAL_TABLET | Freq: Every day | ORAL | Status: DC
  Administered 2019-10-07 – 2019-10-10 (×4): 10 mg via ORAL
  Filled 2019-10-07 (×4): qty 1

## 2019-10-07 MED ORDER — PROPRANOLOL HCL 10 MG PO TABS
10.0000 mg | ORAL_TABLET | Freq: Three times a day (TID) | ORAL | Status: DC
  Administered 2019-10-07 – 2019-10-10 (×10): 10 mg via ORAL
  Filled 2019-10-07 (×15): qty 1

## 2019-10-07 MED ORDER — METHOTREXATE 2.5 MG PO TABS: 10.0000 mg | ORAL_TABLET | ORAL | Status: DC

## 2019-10-07 MED ORDER — SODIUM CHLORIDE 0.9 % IV SOLN: INTRAVENOUS | Status: AC

## 2019-10-07 MED ORDER — IOHEXOL 300 MG/ML  SOLN
60.0000 mL | Freq: Once | INTRAMUSCULAR | Status: AC | PRN
  Administered 2019-10-07: 60 mL via INTRAVENOUS

## 2019-10-07 MED ORDER — POLYMYXIN B-TRIMETHOPRIM 10000-0.1 UNIT/ML-% OP SOLN
2.0000 [drp] | Freq: Four times a day (QID) | OPHTHALMIC | Status: DC
  Administered 2019-10-07 – 2019-10-10 (×13): 2 [drp] via OPHTHALMIC
  Filled 2019-10-07: qty 10

## 2019-10-07 MED ORDER — APIXABAN 5 MG PO TABS
5.0000 mg | ORAL_TABLET | Freq: Two times a day (BID) | ORAL | Status: DC
  Administered 2019-10-07 – 2019-10-10 (×7): 5 mg via ORAL
  Filled 2019-10-07 (×7): qty 1

## 2019-10-07 MED ORDER — PANTOPRAZOLE SODIUM 40 MG PO TBEC
40.0000 mg | DELAYED_RELEASE_TABLET | Freq: Every day | ORAL | Status: DC
  Administered 2019-10-07 – 2019-10-10 (×4): 40 mg via ORAL
  Filled 2019-10-07 (×4): qty 1

## 2019-10-07 MED ORDER — GADOBUTROL 1 MMOL/ML IV SOLN
5.5000 mL | Freq: Once | INTRAVENOUS | Status: AC | PRN
  Administered 2019-10-07: 5.5 mL via INTRAVENOUS

## 2019-10-07 MED ORDER — SERTRALINE HCL 50 MG PO TABS
50.0000 mg | ORAL_TABLET | Freq: Every day | ORAL | Status: DC
  Administered 2019-10-07 – 2019-10-10 (×4): 50 mg via ORAL
  Filled 2019-10-07 (×4): qty 1

## 2019-10-07 MED ORDER — SODIUM CHLORIDE 0.9 % IV SOLN: INTRAVENOUS | Status: DC

## 2019-10-07 MED ORDER — ASPIRIN EC 81 MG PO TBEC
81.0000 mg | DELAYED_RELEASE_TABLET | Freq: Every day | ORAL | Status: DC
  Administered 2019-10-07 – 2019-10-10 (×4): 81 mg via ORAL
  Filled 2019-10-07 (×4): qty 1

## 2019-10-07 MED ORDER — FOLIC ACID 1 MG PO TABS
1.0000 mg | ORAL_TABLET | Freq: Two times a day (BID) | ORAL | Status: DC
  Administered 2019-10-07 – 2019-10-10 (×7): 1 mg via ORAL
  Filled 2019-10-07 (×7): qty 1

## 2019-10-07 MED ORDER — AZITHROMYCIN 250 MG PO TABS
500.0000 mg | ORAL_TABLET | Freq: Once | ORAL | Status: AC
  Administered 2019-10-08: 500 mg via ORAL
  Filled 2019-10-07: qty 2

## 2019-10-07 MED ORDER — SODIUM CHLORIDE 0.9 % IV SOLN
1.0000 g | INTRAVENOUS | Status: DC
  Administered 2019-10-07 – 2019-10-08 (×2): 1 g via INTRAVENOUS
  Filled 2019-10-07: qty 10
  Filled 2019-10-07: qty 1
  Filled 2019-10-07 (×3): qty 10

## 2019-10-07 MED ORDER — OXYBUTYNIN CHLORIDE 5 MG PO TABS
5.0000 mg | ORAL_TABLET | Freq: Two times a day (BID) | ORAL | Status: DC
  Administered 2019-10-07 – 2019-10-10 (×7): 5 mg via ORAL
  Filled 2019-10-07 (×8): qty 1

## 2019-10-07 NOTE — Evaluation (Signed)
Physical Therapy Evaluation Patient Details Name: Renee Alvarado MRN: [id number] DOB: 07-Jan-1927 Today's Date: 10/07/2019   History of Present Illness  Pt is a 86 y/o female admitted secondary to CAP and frequent falls. PMH includes DVT, HTN, polymayalgia rheumatica and SLE.   Clinical Impression  Pt admitted secondary to problem above with deficits below. Pt requiring mod A to stand and perform side steps at edge of stretcher this session. Pt with posterior lean and was unable to correct this session. Per daughter, she plans to take pt home with her and can provide necessary assist. Educated about using WC for increased safety initially. Will continue to follow acutely to maximize functional mobility independence and safety.     Follow Up Recommendations Home health PT;Supervision/Assistance - 24 hour (pt's daughter reports she can provide assist)    Equipment Recommendations  None recommended by PT    Recommendations for Other Services       Precautions / Restrictions Precautions Precautions: Fall Restrictions Weight Bearing Restrictions: No      Mobility  Bed Mobility Overal bed mobility: Needs Assistance Bed Mobility: Supine to Sit;Sit to Supine     Supine to sit: Mod assist Sit to supine: Mod assist   General bed mobility comments: Mod A for trunk elevation to come to sitting. Required mod A for LE assist for return to supine.   Transfers Overall transfer level: Needs assistance Equipment used: None Transfers: Sit to/from Stand Sit to Stand: Mod assist         General transfer comment: Mod A for lift assist and steadying to stand. Pt with posterior lean and required mod A to maintain balance. PT stood in front of pt and had pt hold to PT arms.   Ambulation/Gait Ambulation/Gait assistance: Mod assist           General Gait Details: Took side steps at EOB this session. Pt with posterior lean and requiring mod A for steadying. PT stood in front of pt and had pt  hold to PT arms.   Stairs            Wheelchair Mobility    Modified Rankin (Stroke Patients Only)       Balance Overall balance assessment: Needs assistance Sitting-balance support: No upper extremity supported;Feet supported Sitting balance-Leahy Scale: Fair   Postural control: Posterior lean Standing balance support: Bilateral upper extremity supported;During functional activity Standing balance-Leahy Scale: Poor Standing balance comment: Posterior lean noted in standing. Required external support for stability.                              Pertinent Vitals/Pain Pain Assessment: Faces Faces Pain Scale: Hurts little more Pain Location: bilateral hips  Pain Descriptors / Indicators: Grimacing;Guarding Pain Intervention(s): Limited activity within patient's tolerance;Monitored during session;Repositioned    Home Living Family/patient expects to be discharged to:: Private residence Living Arrangements: Children Available Help at Discharge: Family;Available 24 hours/day Type of Home: House Home Access: Ramped entrance     Home Layout: One level Home Equipment: Walker - 4 wheels;Bedside commode;Shower seat;Wheelchair - manual Additional Comments: Pt's daughter reports she plans to take the pt home with her at d/c     Prior Function Level of Independence: Needs assistance   Gait / Transfers Assistance Needed: Ambulates with rollator for short distances. requires WC for longer distances  ADL's / Homemaking Assistance Needed: Daughter reports ADLs have become more difficult and will need assist.  Hand Dominance        Extremity/Trunk Assessment   Upper Extremity Assessment Upper Extremity Assessment: Generalized weakness    Lower Extremity Assessment Lower Extremity Assessment: Generalized weakness    Cervical / Trunk Assessment Cervical / Trunk Assessment: Normal  Communication   Communication: HOH  Cognition Arousal/Alertness:  Awake/alert Behavior During Therapy: WFL for tasks assessed/performed Overall Cognitive Status: Within Functional Limits for tasks assessed                                        General Comments      Exercises     Assessment/Plan    PT Assessment Patient needs continued PT services  PT Problem List Decreased strength;Decreased balance;Decreased mobility;Decreased activity tolerance;Decreased knowledge of use of DME;Decreased knowledge of precautions;Decreased coordination       PT Treatment Interventions Gait training;DME instruction;Functional mobility training;Therapeutic exercise;Therapeutic activities;Balance training;Patient/family education    PT Goals (Current goals can be found in the Care Plan section)  Acute Rehab PT Goals Patient Stated Goal: to go home PT Goal Formulation: With patient Time For Goal Achievement: 10/21/19 Potential to Achieve Goals: Good    Frequency Min 3X/week   Barriers to discharge        Co-evaluation               AM-PAC PT "6 Clicks" Mobility  Outcome Measure Help needed turning from your back to your side while in a flat bed without using bedrails?: A Little Help needed moving from lying on your back to sitting on the side of a flat bed without using bedrails?: A Lot Help needed moving to and from a bed to a chair (including a wheelchair)?: A Lot Help needed standing up from a chair using your arms (e.g., wheelchair or bedside chair)?: A Lot Help needed to walk in hospital room?: A Lot Help needed climbing 3-5 steps with a railing? : Total 6 Click Score: 12    End of Session Equipment Utilized During Treatment: Gait belt Activity Tolerance: Patient tolerated treatment well Patient left: in bed;with call bell/phone within reach;with family/visitor present (on stretcher in ED ) Nurse Communication: Mobility status PT Visit Diagnosis: Unsteadiness on feet (R26.81);Muscle weakness (generalized) (M62.81)     Time: 4081-4481 PT Time Calculation (min) (ACUTE ONLY): 13 min   Charges:   PT Evaluation $PT Eval Moderate Complexity: 1 Mod          Renee Alvarado, PT, DPT  Acute Rehabilitation Services  Pager: [phone] Office: [phone]   Renee Alvarado 10/07/2019, 3:42 PM

## 2019-10-07 NOTE — ED Notes (Signed)
Pt assisted to sitting on side of bed with legs over edge to eat breakfast.

## 2019-10-07 NOTE — H&P (Signed)
History and Physical    Renee Alvarado DJM:[id number] DOB: April 02, 1926 DOA: 10/06/2019  PCP: Patient, No Pcp Per  Patient coming from: Home   Chief Complaint:  Chief Complaint  Patient presents with  . Fall     HPI:    86 year old female with past medical history of melanoma (removed from the forehead 07/2019), previous DVT (on lifelong eliquis), lupus erythematosus, polymyalgia rheumatica, hypertension, osteoporosis who presents to [facility] emergency department after experiencing a an unwitnessed fall.  Patient is a somewhat poor historian and additional history has been obtained from the daughter-in-law who is sitting at the bedside.  Patient has been experiencing frequent falls for approximately the past 2 to 3 weeks.  Most of these falls are witnessed by family members and seem to be mechanical in nature with the patient tripping on various things around the house.  Patient has suffered injuries to her head on multiple occasions including suffering a left scalp hematoma approximately 2 weeks ago.  In the past week, the patient has begun to develop an increase in frequency and severity of her chronic cough.  This is not productive.  Patient denies any associated fevers, weakness or shortness of breath.  Patient denies recent travel, sick contacts or contacts with confirmed COVID-19 infection.  Upon further questioning, patient denies abdominal pain, chest pain, nausea, vomiting, dysuria or diarrhea.  Patient denies being started on any new medications in the past several weeks.  Patient's frequent falls continue to occur.  Earlier in the day on 8/29, patient suffered yet another fall.  While this was an unwitnessed fall, patient was unable to recall the fall at all, stating that she simply woke up on the floor.  When family came to her side, they deny seeing any seizure-like activity.    It was at this point that the patient was brought in to [facility] emergency  department for evaluation.  Upon evaluation in the emergency department, patient was found to be febrile on arrival with temperature of 102 F.  Chest x-ray has revealed patchy bilateral chest infiltrates concerning for atypical pneumonia.  Patient was initiated on a regimen of intravenous ceftriaxone and azithromycin for suspected developing bilateral pneumonia.  The hospitalist group was then called to assess the patient for admission to the hospital.   Review of Systems:  Review of Systems  Constitutional: Positive for fever and malaise/fatigue.  Musculoskeletal: Positive for falls.  Neurological: Positive for weakness.  All other systems reviewed and are negative.     Past Medical History:  Diagnosis Date  . Essential hypertension 10/07/2019  . GERD without esophagitis 10/07/2019  . History of DVT (deep vein thrombosis) 10/07/2019  . Melanoma of forehead ([place]) 10/07/2019  . Polymyalgia rheumatica ([place]) 10/07/2019  . Systemic lupus erythematosus ([place]) 10/07/2019    History reviewed. No pertinent surgical history.   reports that she has never smoked. She has never used smokeless tobacco. No history on file for alcohol use and drug use.  No Known Allergies  Family History  Problem Relation Age of Onset  . Heart disease Neg Hx      Prior to Admission medications   Medication Sig Start Date End Date Taking? Authorizing Provider  acetaminophen (TYLENOL) 325 MG tablet Take 650 mg by mouth every 6 (six) hours as needed for headache (pain).   Yes [provider]  apixaban (ELIQUIS) 5 MG TABS tablet Take 5 mg by mouth 2 (two) times daily.   Yes [provider]  aspirin  EC 81 MG tablet Take 81 mg by mouth daily. Swallow whole.   Yes [provider]  Calcium Carb-Cholecalciferol (CALCIUM 600-D PO) Take 1 tablet by mouth 2 (two) times daily.   Yes [provider]  esomeprazole (NEXIUM) 20 MG capsule Take 20 mg by mouth daily at 12 noon.   Yes [provider]  folic acid (FOLVITE) 1 MG tablet Take 1 mg by mouth 2 (two) times daily.   Yes [provider]  Magnesium 250 MG TABS Take 250 mg by mouth daily.   Yes [provider]  methotrexate (RHEUMATREX) 2.5 MG tablet Take 10 mg by mouth every Sunday. Caution:Chemotherapy. Protect from light.   Yes [provider]  Multiple Vitamin (MULTIVITAMIN WITH MINERALS) TABS tablet Take 1 tablet by mouth daily.   Yes [provider]  oxybutynin (DITROPAN) 5 MG tablet Take 5 mg by mouth 2 (two) times daily.   Yes [provider]  predniSONE (DELTASONE) 5 MG tablet Take 10 mg by mouth daily with breakfast.   Yes [provider]  propranolol (INDERAL) 10 MG tablet Take 10 mg by mouth 3 (three) times daily.   Yes [provider]  sertraline (ZOLOFT) 50 MG tablet Take 50 mg by mouth daily.   Yes [provider]    Physical Exam: Vitals:   10/06/19 2145 10/06/19 2200 10/06/19 2215 10/06/19 2230  BP: (!) 160/53 (!) 167/51 (!) 164/54 (!) 155/53  Pulse: 70 76 71 77  Resp: 15 14 (!) 21 19  Temp:      TempSrc:      SpO2: 100% 97% 99% 98%  Weight:      Height:        Constitutional: Acute alert and oriented x3, no associated distress.   Skin: Notable ecchymosis on the left temporoparietal region of the scalp.  Notable large ulcerations over the forehead with surrounding scarring and tenting of the skin.  Otherwise, poor skin turgor noted.   Eyes: Extensive purulent discharge noted at the bilateral eyes.  Notable injected sclera bilaterally without associated conjunctival pallor.  Pupils are equally reactive to light.   ENMT: Dry mucous membranes noted.  Posterior pharynx clear of any exudate or lesions.   Neck: normal, supple, no masses, no thyromegaly.  No evidence of jugular venous distension.   Respiratory: clear to auscultation bilaterally, no wheezing, no crackles. Normal respiratory effort. No accessory muscle use.    Cardiovascular: Regular rate and rhythm, no murmurs / rubs / gallops. No extremity edema. 2+ pedal pulses. No carotid bruits.  Chest:   Nontender without crepitus or deformity.   Back:   Nontender without crepitus or deformity. Abdomen: Abdomen is soft and nontender.  No evidence of intra-abdominal masses.  Positive bowel sounds noted in all quadrants.   Musculoskeletal: Notable pain with both passive and active range of motion of the bilateral shoulders and bilateral hips.  No significant deformities noted.   no contractures.  Poor muscle tone noted. Neurologic: CN 2-12 grossly intact. Sensation intact, Notable right foot drop.  Additionally, there seems to be a discernible weakness of the left lower extremity proximal and distal muscle groups.  Patient is following all commands.  Patient is responsive to verbal stimuli.   Psychiatric: Patient presents as a normal mood with appropriate affect.  Patient seems to possess insight as to theircurrent situation.     Labs on Admission: I have personally reviewed following labs and imaging studies -   CBC: Recent Labs  Lab  10/06/19 1858 10/06/19 1904  WBC 10.3  --   HGB 10.6* 9.5*  HCT 33.6* 28.0*  MCV 92.6  --   PLT 220  --    Basic Metabolic Panel: Recent Labs  Lab 10/06/19 1858 10/06/19 1904  NA 139 139  K 3.7 3.7  CL 106 105  CO2 24  --   GLUCOSE 117* 109*  BUN 24* 23  CREATININE 1.14* 1.10*  CALCIUM 8.8*  --    GFR: Estimated Creatinine Clearance: 29.4 mL/min (A) (by C-G formula based on SCr of 1.1 mg/dL (H)). Liver Function Tests: Recent Labs  Lab 10/06/19 1858  AST 18  ALT 16  ALKPHOS 52  BILITOT 0.8  PROT 5.7*  ALBUMIN 2.9*   No results for input(s): LIPASE, AMYLASE in the last 168 hours. No results for input(s): AMMONIA in the last 168 hours. Coagulation Profile: Recent Labs  Lab 10/06/19 1858  INR 1.3*   Cardiac Enzymes: No results for input(s): CKTOTAL, CKMB, CKMBINDEX, TROPONINI in the last 168  hours. BNP (last 3 results) No results for input(s): PROBNP in the last 8760 hours. HbA1C: No results for input(s): HGBA1C in the last 72 hours. CBG: No results for input(s): GLUCAP in the last 168 hours. Lipid Profile: No results for input(s): CHOL, HDL, LDLCALC, TRIG, CHOLHDL, LDLDIRECT in the last 72 hours. Thyroid Function Tests: No results for input(s): TSH, T4TOTAL, FREET4, T3FREE, THYROIDAB in the last 72 hours. Anemia Panel: No results for input(s): VITAMINB12, FOLATE, FERRITIN, TIBC, IRON, RETICCTPCT in the last 72 hours. Urine analysis:    Component Value Date/Time   COLORURINE YELLOW 10/06/2019 1856   APPEARANCEUR CLEAR 10/06/2019 1856   LABSPEC 1.008 10/06/2019 1856   PHURINE 7.0 10/06/2019 1856   GLUCOSEU NEGATIVE 10/06/2019 1856   HGBUR NEGATIVE 10/06/2019 1856   BILIRUBINUR NEGATIVE 10/06/2019 1856   KETONESUR NEGATIVE 10/06/2019 1856   PROTEINUR NEGATIVE 10/06/2019 1856   NITRITE NEGATIVE 10/06/2019 1856   LEUKOCYTESUR TRACE (A) 10/06/2019 1856    Radiological Exams on Admission - Personally Reviewed: CT Head Wo Contrast  Result Date: 10/06/2019 CLINICAL DATA:  Status post trauma. EXAM: CT HEAD WITHOUT CONTRAST TECHNIQUE: Contiguous axial images were obtained from the base of the skull through the vertex without intravenous contrast. COMPARISON:  October 04, 2018 FINDINGS: Brain: There is mild cerebral atrophy with widening of the extra-axial spaces and ventricular dilatation. There are areas of decreased attenuation within the white matter tracts of the supratentorial brain, consistent with microvascular disease changes. Vascular: No hyperdense vessel or unexpected calcification. Skull: Normal. Negative for fracture or focal lesion. Sinuses/Orbits: No acute finding. Other: Mild left frontal parietal scalp soft tissue swelling is seen. IMPRESSION: Mild left frontal parietal scalp soft tissue swelling, without evidence of acute fracture or acute intracranial abnormality.  Electronically Signed   By: Virgina Norfolk M.D.   On: 10/06/2019 20:15   CT Cervical Spine Wo Contrast  Result Date: 10/06/2019 CLINICAL DATA:  Facial trauma, posterior scalp hematoma, fell, anticoagulated EXAM: CT CERVICAL SPINE WITHOUT CONTRAST TECHNIQUE: Multidetector CT imaging of the cervical spine was performed without intravenous contrast. Multiplanar CT image reconstructions were also generated. COMPARISON:  08/18/2017 FINDINGS: Alignment: There is right convex scoliosis of the cervical spine. Mild anterolisthesis of C4 on C5 is noted, likely due to extensive spondylosis and facet hypertrophy. Skull base and vertebrae: There are no acute displaced cervical spine fractures. Soft tissues and spinal canal: No prevertebral fluid or swelling. No visible canal hematoma. Disc levels: Severe hypertrophic changes are seen  at the C1/C2 interface, right greater than left. There is bony fusion across the facet joints from C2 through C4. Severe facet hypertrophy is noted at C4/C5 and C5/C6. The left C6/C7 facets are fused. There is extensive multilevel spondylosis, most pronounced at the C4/C5 level. There is bilateral neural foraminal encroachment at C4/C5 lateralizing to the right, and symmetrically at C5/C6. Left predominant neural foraminal narrowing is seen at C6/C7. Upper chest: Airway is patent. Biapical pleural and parenchymal scarring, as well as calcified pleural plaques, are noted. Other: Reconstructed images demonstrate no additional findings. IMPRESSION: 1. No evidence of cervical spine fracture. 2. Extensive multilevel cervical degenerative changes as above. Electronically Signed   By: Randa Ngo M.D.   On: 10/06/2019 20:16   DG Pelvis Portable  Result Date: 10/06/2019 CLINICAL DATA:  86 year old post fall. EXAM: PORTABLE PELVIS 1-2 VIEWS COMPARISON:  None. FINDINGS: The cortical margins of the bony pelvis are intact. No fracture. Pubic symphysis and sacroiliac joints are congruent. Both  femoral heads are well-seated in the respective acetabula. Degenerative change of the sacroiliac joints, hips, and pubic symphysis. Advanced vascular calcifications. IMPRESSION: No pelvic fracture. Electronically Signed   By: Keith Rake M.D.   On: 10/06/2019 19:26   DG Chest Port 1 View  Result Date: 10/06/2019 CLINICAL DATA:  86 year old post fall at home. EXAM: PORTABLE CHEST 1 VIEW COMPARISON:  Radiograph 10/04/2018 FINDINGS: Stable heart size and mediastinal contours. Aortic atherosclerosis. No pneumothorax. There is biapical pleuroparenchymal scarring. Ill-defined opacities in the periphery of the left greater than right mid lung. No significant pleural effusion. Chronic change of the left shoulder. No acute osseous abnormalities are seen. IMPRESSION: Ill-defined opacities in the periphery of the left greater than right mid lung, indeterminate for pulmonary contusion versus infection in the setting of fall. Aortic Atherosclerosis (ICD10-I70.0). Electronically Signed   By: Keith Rake M.D.   On: 10/06/2019 19:25   DG Shoulder Left  Result Date: 10/06/2019 CLINICAL DATA:  Left shoulder pain. EXAM: LEFT SHOULDER - 2+ VIEW COMPARISON:  June 06, 2019 (1:35 p.m.) FINDINGS: There is no evidence of an acute fracture or dislocation. Mild degenerative changes seen involving the left acromioclavicular joint and left glenohumeral articulation. Moderate severity chronic changes are noted along the inferior aspect of the left glenoid. An 8 mm well-defined area of soft tissue calcification is seen just below the left glenoid. This is seen on the prior study. Mild soft tissue swelling is seen above the left acromioclavicular joint. IMPRESSION: 1. Degenerative changes, as described above, without evidence of acute fracture or dislocation. 2. Soft tissue swelling above the left acromioclavicular joint. Electronically Signed   By: Virgina Norfolk M.D.   On: 10/06/2019 19:55    EKG: Personally reviewed.   Rhythm is normal sinus rhythm with heart rate of 79 bpm.  Notable Q waves in the anterior septal leads.  Evidence of intraventricular conduction delay.  No dynamic ST segment changes appreciated.  Assessment/Plan Principal Problem:   Pneumonia of both lungs due to infectious organism   Patient presenting with frequent falls in the past several weeks with increasing intensity and frequency of chronic cough.  Patient presenting with fever of 77 F with evidence of patchy bilateral infiltrates on chest x-ray concerning for developing atypical pneumonia  Intravenous ceftriaxone and azithromycin have been initiated.  Considering recent removal of multiple lesions associated melanoma from the forehead several months ago and ongoing methotrexate use, will obtain CT imaging of the chest with contrast to confirm that these findings are  indeed secondary to pneumonia and not secondary to malignancy or methotrexate induced lung disease.  We will hydrate patient gently with intravenous isotonic fluids.  Blood cultures obtained and are pending.  We will provide patient with supplemental oxygen for bouts of hypoxia  Active Problems:   Frequent falls   Trauma survey in the emergency department reveals no evidence of fracture or other significant injury  Patient has no recollection of most recent fall earlier in the day on 8/29 making syncope a concern.  We will therefore monitor patient on telemetry  Obtain orthostatic vital signs  Obtain echocardiogram  PT evaluation in the morning  Also, considering notable left lower extremity weakness on neurologic exam and recent removal of multiple lesions of the forehead related to mild myeloma several months ago will obtain MRI brain to ensure there is no evidence of metastatic disease.    History of DVT (deep vein thrombosis)   Continue home regimen of Eliquis    Melanoma of forehead (HCC)   Status post removal of multiple areas secondary to  multiple myeloma in June -patient continues to exhibit multiple concerning ulcerations of the forehead with surrounding scarring of the skin  Obtaining CT imaging of the chest as well as MRI of the brain to ensure there is no evidence of metastatic disease.    Systemic lupus erythematosus ([place])   Continuing home regimen of methotrexate for now    Polymyalgia rheumatica ([place])   Continue home regimen of prednisone for now    Acute bacterial conjunctivitis of both eyes   Substantial purulent discharge noted of the bilateral eyes on examination  Placing patient on Polytrim eyedrops 4 times daily for a minimum of 5 days.    Essential hypertension   Continue home regimen of propranolol    GERD without esophagitis    Continue home regimen of daily PPI   Code Status:  DNR Family Communication: Daughter-in-law is at bedside and has been updated on plan of care.  Status is: Observation  The patient remains OBS appropriate and will d/c before 2 midnights.  Dispo: The patient is from: Home              Anticipated d/c is to: Home              Anticipated d/c date is: 2 days              Patient currently is not medically stable to d/c.        Vernelle Emerald MD [facility] Pager [phone]  If 7PM-7AM, please contact night-coverage [email or web address] Use universal [place] password for that web site. If you do not have the password, please call the hospital operator.  10/07/2019, 2:04 AM

## 2019-10-07 NOTE — Progress Notes (Signed)
[facility] PROGRESS NOTE   Renee Alvarado GUY:[id number] DOB: [birth year] DOA: 10/06/2019  PCP: Patient, No Pcp Per  Brief History/Interval Summary: 86 year old Caucasian female with a past medical history of melanoma removal of lesions from her forehead June, previous history of DVT on lifelong Eliquis, history of SLE, polymyalgia rheumatica, essential hypertension, osteoporosis who presented after experiencing an unwitnessed fall.  There was some concern for syncope.  Patient was found to have pneumonia on evaluation.  She was noted to be febrile.  She was hospitalized for further management.  Reason for Visit: Community-acquired pneumonia.  Consultants: None  Procedures: None  Antibiotics: Anti-infectives (From admission, onward)   Start     Dose/Rate Route Frequency Ordered Stop   10/07/19 2000  cefTRIAXone (ROCEPHIN) 1 g in sodium chloride 0.9 % 100 mL IVPB        1 g 200 mL/hr over 30 Minutes Intravenous Every 24 hours 10/07/19 0245     10/06/19 2045  cefTRIAXone (ROCEPHIN) 2 g in sodium chloride 0.9 % 100 mL IVPB  Status:  Discontinued        2 g 200 mL/hr over 30 Minutes Intravenous Every 24 hours 10/06/19 2033 10/07/19 0245   10/06/19 2045  azithromycin (ZITHROMAX) 500 mg in sodium chloride 0.9 % 250 mL IVPB        500 mg 250 mL/hr over 60 Minutes Intravenous Every 24 hours 10/06/19 2033        Subjective/Interval History: Patient complains of dryness and crusting in the left eye.  Also complains of cough with some shortness of breath.  No chest pain.  No nausea vomiting.    Assessment/Plan:  Community-acquired pneumonia, bilateral lungs Could be the reason for her falls.  She was febrile on presentation.  X-ray showed patchy bilateral infiltrates.  CT scan showed consolidation in the left lung.  No masses or other lesions were noted.  Continue ceftriaxone and azithromycin.  Currently not requiring any oxygen.  Frequent falls/concern for syncope No evidence for  fractures on imaging studies.  PT evaluation.  Echocardiogram due to concern for syncope.  It does not show any concerning findings.  Left ventricular ejection fraction was normal.  No significant valvular disease.  History of melanoma Recent removal of multiple lesions in the left forehead area.  MRI brain does not show any metastatic process.  No strokes.  History of DVT on chronic anticoagulation Continue Eliquis.  History of SLE/polymyalgia rheumatica Continue home medications including methotrexate.  Patient also on prednisone chronically.  Acute bacterial conjunctivitis of left eye No eyedrops have been ordered.  Essential hypertension Continue propranolol.  GERD Continue PPI  DVT Prophylaxis: On Eliquis Code Status: DNR Family Communication: Discussed with patient Disposition Plan: Hopefully return home when improved.  PT evaluation.  Status is: Inpatient  Remains inpatient appropriate because:IV treatments appropriate due to intensity of illness or inability to take PO   Dispo: The patient is from: Home              Anticipated d/c is to: Home              Anticipated d/c date is: 2 days              Patient currently is not medically stable to d/c.      Medications:  Scheduled: . apixaban  5 mg Oral BID  . aspirin EC  81 mg Oral Daily  . folic acid  1 mg Oral BID  . [START ON 10/13/2019] methotrexate  10 mg Oral Q Sun  . oxybutynin  5 mg Oral BID  . pantoprazole  40 mg Oral Daily  . predniSONE  10 mg Oral Q breakfast  . propranolol  10 mg Oral TID  . sertraline  50 mg Oral Daily  . trimethoprim-polymyxin b  2 drop Both Eyes Q6H   Continuous: . sodium chloride 100 mL/hr at 10/07/19 0412  . azithromycin Stopped (10/06/19 2221)  . cefTRIAXone (ROCEPHIN)  IV     PRN:   Objective:  Vital Signs  Vitals:   10/07/19 0407 10/07/19 0430 10/07/19 0530 10/07/19 1052  BP: (!) 180/134 (!) 185/150 (!) 161/54   Pulse: 75 90 70   Resp:   (!) 21   Temp:     99.2 F (37.3 C)  TempSrc:    Oral  SpO2: 100% 99% 93%   Weight:      Height:       No intake or output data in the 24 hours ending 10/07/19 1053 Filed Weights   10/06/19 1851  Weight: 57.2 kg    General appearance: Awake alert.  In no distress Resp: Crackles bilateral bases.  Left more than right.  Normal effort.  No wheezing or rhonchi. Cardio: S1-S2 is normal regular.  No S3-S4.  No rubs murmurs or bruit GI: Abdomen is soft.  Nontender nondistended.  Bowel sounds are present normal.  No masses organomegaly Extremities: No edema.  Full range of motion of lower extremities. Neurologic:  No focal neurological deficits.    Lab Results:  Data Reviewed: I have personally reviewed following labs and imaging studies  CBC: Recent Labs  Lab 10/06/19 1858 10/06/19 1904  WBC 10.3  --   HGB 10.6* 9.5*  HCT 33.6* 28.0*  MCV 92.6  --   PLT 220  --     Basic Metabolic Panel: Recent Labs  Lab 10/06/19 1858 10/06/19 1904 10/07/19 0528  NA 139 139 140  K 3.7 3.7 3.8  CL 106 105 108  CO2 24  --  23  GLUCOSE 117* 109* 111*  BUN 24* 23 17  CREATININE 1.14* 1.10* 1.05*  CALCIUM 8.8*  --  8.3*    GFR: Estimated Creatinine Clearance: 30.8 mL/min (A) (by C-G formula based on SCr of 1.05 mg/dL (H)).  Liver Function Tests: Recent Labs  Lab 10/06/19 1858 10/07/19 0528  AST 18 16  ALT 16 13  ALKPHOS 52 50  BILITOT 0.8 0.9  PROT 5.7* 4.7*  ALBUMIN 2.9* 2.5*    Coagulation Profile: Recent Labs  Lab 10/06/19 1858  INR 1.3*     Recent Results (from the past 240 hour(s))  SARS Coronavirus 2 by RT PCR (hospital order, performed in [facility] hospital lab) Nasopharyngeal Nasopharyngeal Swab     Status: None   Collection Time: 10/06/19  7:02 PM   Specimen: Nasopharyngeal Swab  Result Value Ref Range Status   SARS Coronavirus 2 NEGATIVE NEGATIVE Final    Comment: (NOTE) SARS-CoV-2 target nucleic acids are NOT DETECTED.  The SARS-CoV-2 RNA is generally detectable in  upper and lower respiratory specimens during the acute phase of infection. The lowest concentration of SARS-CoV-2 viral copies this assay can detect is 250 copies / mL. A negative result does not preclude SARS-CoV-2 infection and should not be used as the sole basis for treatment or other patient management decisions.  A negative result may occur with improper specimen collection / handling, submission of specimen other than nasopharyngeal swab, presence of viral mutation(s) within the  areas targeted by this assay, and inadequate number of viral copies (<250 copies / mL). A negative result must be combined with clinical observations, patient history, and epidemiological information.  Fact Sheet for Patients:   StrictlyIdeas.no  Fact Sheet for Healthcare Providers: BankingDealers.co.za  This test is not yet approved or  cleared by the [place] FDA and has been authorized for detection and/or diagnosis of SARS-CoV-2 by FDA under an Emergency Use Authorization (EUA).  This EUA will remain in effect (meaning this test can be used) for the duration of the COVID-19 declaration under Section 564(b)(1) of the Act, 21 U.S.C. section 360bbb-3(b)(1), unless the authorization is terminated or revoked sooner.  Performed at [facility], [place] [address], [place], [place] [address]   Blood culture (routine x 2)     Status: None (Preliminary result)   Collection Time: 10/06/19  9:28 PM   Specimen: BLOOD  Result Value Ref Range Status   Specimen Description BLOOD LEFT ANTECUBITAL  Final   Special Requests   Final    BOTTLES DRAWN AEROBIC AND ANAEROBIC Blood Culture adequate volume   Culture   Final    NO GROWTH < 12 HOURS Performed at [facility], [place] [address] [place], [place] [address]    Report Status PENDING  Incomplete      Radiology Studies: CT Head Wo Contrast  Result Date: 10/06/2019 CLINICAL DATA:  Status post  trauma. EXAM: CT HEAD WITHOUT CONTRAST TECHNIQUE: Contiguous axial images were obtained from the base of the skull through the vertex without intravenous contrast. COMPARISON:  October 04, 2018 FINDINGS: Brain: There is mild cerebral atrophy with widening of the extra-axial spaces and ventricular dilatation. There are areas of decreased attenuation within the white matter tracts of the supratentorial brain, consistent with microvascular disease changes. Vascular: No hyperdense vessel or unexpected calcification. Skull: Normal. Negative for fracture or focal lesion. Sinuses/Orbits: No acute finding. Other: Mild left frontal parietal scalp soft tissue swelling is seen. IMPRESSION: Mild left frontal parietal scalp soft tissue swelling, without evidence of acute fracture or acute intracranial abnormality. Electronically Signed   By: Virgina Norfolk M.D.   On: 10/06/2019 20:15   CT CHEST W CONTRAST  Result Date: 10/07/2019 CLINICAL DATA:  [place], fever, abnormal chest x-ray EXAM: CT CHEST WITH CONTRAST TECHNIQUE: Multidetector CT imaging of the chest was performed during intravenous contrast administration. CONTRAST:  26mL OMNIPAQUE IOHEXOL 300 MG/ML  SOLN COMPARISON:  11/18/2015, 10/06/2019 FINDINGS: Cardiovascular: The heart is mildly enlarged without pericardial effusion. Normal caliber of the thoracic aorta, with no evidence of dissection. There is extensive atherosclerosis throughout the thoracic aorta and coronary vasculature. Mediastinum/Nodes: No enlarged mediastinal, hilar, or axillary lymph nodes. Thyroid gland, trachea, and esophagus demonstrate no significant findings. Lungs/Pleura: Multifocal areas of consolidation are identified, most pronounced within the mid to upper lung zones. The consolidation within the upper lobes has a somewhat linear configuration, and may reflect atelectasis or scarring. Consolidation within the superior segment left lower lobe is most consistent with pneumonia. There are trace  bilateral pleural effusions, left greater than right. No pneumothorax. The central airways are patent. Upper Abdomen: No acute abnormality. Musculoskeletal: There are no acute displaced fractures. Reconstructed images demonstrate no additional findings. IMPRESSION: 1. Left lower lobe consolidation most compatible with pneumonia. 2. Bilateral upper lobe linear consolidation most compatible with scarring or atelectasis. 3. Trace bilateral pleural effusions, left greater than right. 4.  Aortic Atherosclerosis (ICD10-I70.0). Electronically Signed   By: Diana Eves.D.  On: 10/07/2019 03:02   CT Cervical Spine Wo Contrast  Result Date: 10/06/2019 CLINICAL DATA:  Facial trauma, posterior scalp hematoma, fell, anticoagulated EXAM: CT CERVICAL SPINE WITHOUT CONTRAST TECHNIQUE: Multidetector CT imaging of the cervical spine was performed without intravenous contrast. Multiplanar CT image reconstructions were also generated. COMPARISON:  08/18/2017 FINDINGS: Alignment: There is right convex scoliosis of the cervical spine. Mild anterolisthesis of C4 on C5 is noted, likely due to extensive spondylosis and facet hypertrophy. Skull base and vertebrae: There are no acute displaced cervical spine fractures. Soft tissues and spinal canal: No prevertebral fluid or swelling. No visible canal hematoma. Disc levels: Severe hypertrophic changes are seen at the C1/C2 interface, right greater than left. There is bony fusion across the facet joints from C2 through C4. Severe facet hypertrophy is noted at C4/C5 and C5/C6. The left C6/C7 facets are fused. There is extensive multilevel spondylosis, most pronounced at the C4/C5 level. There is bilateral neural foraminal encroachment at C4/C5 lateralizing to the right, and symmetrically at C5/C6. Left predominant neural foraminal narrowing is seen at C6/C7. Upper chest: Airway is patent. Biapical pleural and parenchymal scarring, as well as calcified pleural plaques, are noted. Other:  Reconstructed images demonstrate no additional findings. IMPRESSION: 1. No evidence of cervical spine fracture. 2. Extensive multilevel cervical degenerative changes as above. Electronically Signed   By: Randa Ngo M.D.   On: 10/06/2019 20:16   MR BRAIN W WO CONTRAST  Result Date: 10/07/2019 CLINICAL DATA:  Metastatic disease evaluation; history of melanoma to the forehead. Fall.  Asymmetric pupils. EXAM: MRI HEAD WITHOUT AND WITH CONTRAST TECHNIQUE: Multiplanar, multiecho pulse sequences of the brain and surrounding structures were obtained without and with intravenous contrast. CONTRAST:  5.60mL GADAVIST GADOBUTROL 1 MMOL/ML IV SOLN COMPARISON:  Head CT from yesterday FINDINGS: Brain: No acute infarction, hemorrhage, hydrocephalus, extra-axial collection or mass lesion. A small nodular appearing focus along the left frontal convexity on coronal postcontrast imaging has a vein like appearance on axial thin slices. Chronic small vessel ischemia with confluent periventricular and pontine gliosis. Small remote cerebellar infarcts. Cerebral volume loss which is generalized Vascular: Normal flow voids and vascular enhancements Skull and upper cervical spine: Thinning and increased enhancement along the skin surface of the central forehead. No underlying marrow signal abnormality. C2-3 and C3-4 fusion with C4-5 advanced disc and facet degeneration leading to anterolisthesis. Small scalp hematoma laterally on the left. Sinuses/Orbits: No evidence of injury. Bilateral cataract resection. IMPRESSION: 1. No acute intracranial finding.  No evidence of brain metastasis. 2. Postoperative forehead without underlying bony abnormality. Left temporal scalp hematoma. 3. Chronic small vessel ischemia. Electronically Signed   By: Monte Fantasia M.D.   On: 10/07/2019 04:26   DG Pelvis Portable  Result Date: 10/06/2019 CLINICAL DATA:  86 year old post fall. EXAM: PORTABLE PELVIS 1-2 VIEWS COMPARISON:  None. FINDINGS: The  cortical margins of the bony pelvis are intact. No fracture. Pubic symphysis and sacroiliac joints are congruent. Both femoral heads are well-seated in the respective acetabula. Degenerative change of the sacroiliac joints, hips, and pubic symphysis. Advanced vascular calcifications. IMPRESSION: No pelvic fracture. Electronically Signed   By: Keith Rake M.D.   On: 10/06/2019 19:26   DG Chest Port 1 View  Result Date: 10/06/2019 CLINICAL DATA:  86 year old post fall at home. EXAM: PORTABLE CHEST 1 VIEW COMPARISON:  Radiograph 10/04/2018 FINDINGS: Stable heart size and mediastinal contours. Aortic atherosclerosis. No pneumothorax. There is biapical pleuroparenchymal scarring. Ill-defined opacities in the periphery of the left greater than right  mid lung. No significant pleural effusion. Chronic change of the left shoulder. No acute osseous abnormalities are seen. IMPRESSION: Ill-defined opacities in the periphery of the left greater than right mid lung, indeterminate for pulmonary contusion versus infection in the setting of fall. Aortic Atherosclerosis (ICD10-I70.0). Electronically Signed   By: Keith Rake M.D.   On: 10/06/2019 19:25   DG Shoulder Left  Result Date: 10/06/2019 CLINICAL DATA:  Left shoulder pain. EXAM: LEFT SHOULDER - 2+ VIEW COMPARISON:  June 06, 2019 (1:35 p.m.) FINDINGS: There is no evidence of an acute fracture or dislocation. Mild degenerative changes seen involving the left acromioclavicular joint and left glenohumeral articulation. Moderate severity chronic changes are noted along the inferior aspect of the left glenoid. An 8 mm well-defined area of soft tissue calcification is seen just below the left glenoid. This is seen on the prior study. Mild soft tissue swelling is seen above the left acromioclavicular joint. IMPRESSION: 1. Degenerative changes, as described above, without evidence of acute fracture or dislocation. 2. Soft tissue swelling above the left  acromioclavicular joint. Electronically Signed   By: Virgina Norfolk M.D.   On: 10/06/2019 19:55   ECHOCARDIOGRAM COMPLETE  Result Date: 10/07/2019    ECHOCARDIOGRAM REPORT   Patient Name:   Renee Alvarado Date of Exam: 10/07/2019 Medical Rec #:  [id number]   Height:       64.0 in Accession #:    [id number]  Weight:       126.0 lb Date of Birth:  [birth year]   BSA:          1.608 m Patient Age:    27 years    BP:           161/54 mmHg Patient Gender: F           HR:           74 bpm. Exam Location:  Inpatient Procedure: 2D Echo, Cardiac Doppler and Color Doppler Indications:    Syncope 780.2 / R55  History:        Patient has no prior history of Echocardiogram examinations.                 Risk Factors:Hypertension and GERD.  Sonographer:    Bernadene Person RDCS Referring Phys: [id number] [place]  1. Left ventricular ejection fraction, by estimation, is 65 to 70%. The left ventricle has normal function. The left ventricle has no regional wall motion abnormalities. There is moderate left ventricular hypertrophy. Left ventricular diastolic parameters are consistent with Grade I diastolic dysfunction (impaired relaxation). Elevated left ventricular end-diastolic pressure.  2. Right ventricular systolic function is normal. The right ventricular size is normal. There is normal pulmonary artery systolic pressure. The estimated right ventricular systolic pressure is 24.2 mmHg.  3. The mitral valve is abnormal. Trivial mitral valve regurgitation.  4. The aortic valve is tricuspid. Aortic valve regurgitation is not visualized. Mild aortic valve sclerosis is present, with no evidence of aortic valve stenosis.  5. The inferior vena cava is normal in size with greater than 50% respiratory variability, suggesting right atrial pressure of 3 mmHg. FINDINGS  Left Ventricle: Left ventricular ejection fraction, by estimation, is 65 to 70%. The left ventricle has normal function. The left ventricle has no regional  wall motion abnormalities. The left ventricular internal cavity size was normal in size. There is  moderate left ventricular hypertrophy. Left ventricular diastolic parameters are consistent with Grade I diastolic dysfunction (impaired relaxation). Elevated left ventricular end-diastolic  pressure. Right Ventricle: The right ventricular size is normal. No increase in right ventricular wall thickness. Right ventricular systolic function is normal. There is normal pulmonary artery systolic pressure. The tricuspid regurgitant velocity is 2.76 m/s, and  with an assumed right atrial pressure of 3 mmHg, the estimated right ventricular systolic pressure is 93.7 mmHg. Left Atrium: Left atrial size was normal in size. Right Atrium: Right atrial size was normal in size. Pericardium: There is no evidence of pericardial effusion. Mitral Valve: The mitral valve is abnormal. There is moderate calcification of the anterior mitral valve leaflet(s). Trivial mitral valve regurgitation. Tricuspid Valve: The tricuspid valve is grossly normal. Tricuspid valve regurgitation is mild. Aortic Valve: The aortic valve is tricuspid. Aortic valve regurgitation is not visualized. Mild aortic valve sclerosis is present, with no evidence of aortic valve stenosis. Pulmonic Valve: The pulmonic valve was normal in structure. Pulmonic valve regurgitation is not visualized. Aorta: The aortic root and ascending aorta are structurally normal, with no evidence of dilitation. Venous: The inferior vena cava is normal in size with greater than 50% respiratory variability, suggesting right atrial pressure of 3 mmHg. IAS/Shunts: No atrial level shunt detected by color flow Doppler.  LEFT VENTRICLE PLAX 2D LVIDd:         4.20 cm  Diastology LVIDs:         2.65 cm  LV e' lateral:   7.01 cm/s LV PW:         1.30 cm  LV E/e' lateral: 16.8 LV IVS:        1.30 cm  LV e' medial:    5.22 cm/s LVOT diam:     1.80 cm  LV E/e' medial:  22.6 LV SV:         68 LV SV Index:    42 LVOT Area:     2.54 cm  RIGHT VENTRICLE RV S prime:     12.00 cm/s TAPSE (M-mode): 2.2 cm LEFT ATRIUM             Index       RIGHT ATRIUM           Index LA diam:        4.20 cm 2.61 cm/m  RA Area:     17.40 cm LA Vol (A2C):   50.9 ml 31.66 ml/m RA Volume:   44.10 ml  27.43 ml/m LA Vol (A4C):   52.5 ml 32.66 ml/m LA Biplane Vol: 51.8 ml 32.22 ml/m  AORTIC VALVE LVOT Vmax:   114.00 cm/s LVOT Vmean:  79.400 cm/s LVOT VTI:    0.267 m  AORTA Ao Root diam: 2.70 cm Ao Asc diam:  2.90 cm MITRAL VALVE                TRICUSPID VALVE MV Area (PHT): 4.89 cm     TR Peak grad:   30.5 mmHg MV Decel Time: 155 msec     TR Vmax:        276.00 cm/s MV E velocity: 118.00 cm/s MV A velocity: 127.00 cm/s  SHUNTS MV E/A ratio:  0.93         Systemic VTI:  0.27 m                             Systemic Diam: 1.80 cm Lyman Bishop MD Electronically signed by Lyman Bishop MD Signature Date/Time: 10/07/2019/10:30:41 AM    Final        LOS:  0 days   [facility] Pager on [email or web address]  10/07/2019, 10:53 AM

## 2019-10-07 NOTE — Progress Notes (Signed)
  Echocardiogram 2D Echocardiogram has been performed.  Renee Alvarado 10/07/2019, 9:08 AM

## 2019-10-08 DIAGNOSIS — D649 Anemia, unspecified: Secondary | ICD-10-CM

## 2019-10-08 LAB — BASIC METABOLIC PANEL
Anion gap: 9 (ref 5–15)
BUN: 17 mg/dL (ref 8–23)
CO2: 21 mmol/L — ABNORMAL LOW (ref 22–32)
Calcium: 8.8 mg/dL — ABNORMAL LOW (ref 8.9–10.3)
Chloride: 111 mmol/L (ref 98–111)
Creatinine, Ser: 1.1 mg/dL — ABNORMAL HIGH (ref 0.44–1.00)
GFR calc Af Amer: 51 mL/min — ABNORMAL LOW (ref >60)
GFR calc non Af Amer: 44 mL/min — ABNORMAL LOW (ref >60)
Glucose, Bld: 105 mg/dL — ABNORMAL HIGH (ref 70–99)
Potassium: 3.9 mmol/L (ref 3.5–5.1)
Sodium: 141 mmol/L (ref 135–145)

## 2019-10-08 LAB — CBC
HCT: 33 % — ABNORMAL LOW (ref 36.0–46.0)
Hemoglobin: 10.3 g/dL — ABNORMAL LOW (ref 12.0–15.0)
MCH: 28.3 pg (ref 26.0–34.0)
MCHC: 31.2 g/dL (ref 30.0–36.0)
MCV: 90.7 fL (ref 80.0–100.0)
Platelets: 258 10*3/uL (ref 150–400)
RBC: 3.64 MIL/uL — ABNORMAL LOW (ref 3.87–5.11)
RDW: 15.5 % (ref 11.5–15.5)
WBC: 7.9 10*3/uL (ref 4.0–10.5)
nRBC: 0 % (ref 0.0–0.2)

## 2019-10-08 MED ORDER — AMLODIPINE BESYLATE 5 MG PO TABS
5.0000 mg | ORAL_TABLET | Freq: Every day | ORAL | Status: DC
  Administered 2019-10-08 – 2019-10-10 (×3): 5 mg via ORAL
  Filled 2019-10-08 (×3): qty 1

## 2019-10-08 MED ORDER — AZITHROMYCIN 250 MG PO TABS
500.0000 mg | ORAL_TABLET | Freq: Every day | ORAL | Status: DC
  Administered 2019-10-08 – 2019-10-09 (×2): 500 mg via ORAL
  Filled 2019-10-08 (×2): qty 2

## 2019-10-08 NOTE — Progress Notes (Signed)
[facility] PROGRESS NOTE   Renee Alvarado VHQ:[id number] DOB: 08/05/1926 DOA: 10/06/2019  PCP: Patient, No Pcp Per  Brief History/Interval Summary: 86 year old Caucasian female with a past medical history of melanoma removal of lesions from her forehead June, previous history of DVT on lifelong Eliquis, history of SLE, polymyalgia rheumatica, essential hypertension, osteoporosis who presented after experiencing an unwitnessed fall.  There was some concern for syncope.  Patient was found to have pneumonia on evaluation.  She was noted to be febrile.  She was hospitalized for further management.  Reason for Visit: Community-acquired pneumonia.  Consultants: None  Procedures: None  Antibiotics: Anti-infectives (From admission, onward)   Start     Dose/Rate Route Frequency Ordered Stop   10/07/19 2330  azithromycin (ZITHROMAX) tablet 500 mg        500 mg Oral  Once 10/07/19 2319 10/08/19 0041   10/07/19 2000  cefTRIAXone (ROCEPHIN) 1 g in sodium chloride 0.9 % 100 mL IVPB        1 g 200 mL/hr over 30 Minutes Intravenous Every 24 hours 10/07/19 0245     10/06/19 2045  cefTRIAXone (ROCEPHIN) 2 g in sodium chloride 0.9 % 100 mL IVPB  Status:  Discontinued        2 g 200 mL/hr over 30 Minutes Intravenous Every 24 hours 10/06/19 2033 10/07/19 0245   10/06/19 2045  azithromycin (ZITHROMAX) 500 mg in sodium chloride 0.9 % 250 mL IVPB        500 mg 250 mL/hr over 60 Minutes Intravenous Every 24 hours 10/06/19 2033        Subjective/Interval History: Patient states that she continues to have some shortness of breath and cough.  Denies any chest pain.  No nausea vomiting.  Eyes are better.    Assessment/Plan:  Community-acquired pneumonia, bilateral lungs Could be the reason for her falls.  She was febrile on presentation.  X-ray showed patchy bilateral infiltrates.  CT scan showed consolidation in the left lung.  No masses or other lesions were noted.  Patient remains on ceftriaxone  and azithromycin.  Currently saturating normal on room air.  Mobilize.    Frequent falls/concern for syncope No evidence for fractures on imaging studies.  PT evaluation.  Echocardiogram was done due to concern for syncope.  It does not show any concerning findings.  Left ventricular ejection fraction was normal.  No significant valvular disease.  No further episodes in the hospital.  Start mobilizing.  History of melanoma Recent removal of multiple lesions in the left forehead area.  MRI brain does not show any metastatic process.  No strokes.  History of DVT on chronic anticoagulation Continue Eliquis.  History of SLE/polymyalgia rheumatica Continue home medications including methotrexate.  Patient also on prednisone chronically.  Acute bacterial conjunctivitis of left eye Eyedrops have been ordered.  Essential hypertension Continue propranolol.  Noted to be poorly controlled.  Noted to be bradycardic so will not increase the dose of propranolol.  Will add amlodipine.  Chronic kidney disease stage IIIa No old labs available.  Renal function is stable.  Normocytic anemia Hemoglobin is stable.  No evidence of overt bleeding.  GERD Continue PPI  DVT Prophylaxis: On Eliquis Code Status: DNR Family Communication: Discussed with patient Disposition Plan: Hopefully return home when improved.  PT evaluation.  Status is: Inpatient  Remains inpatient appropriate because:IV treatments appropriate due to intensity of illness or inability to take PO   Dispo: The patient is from: Home  Anticipated d/c is to: Home              Anticipated d/c date is: 2 days              Patient currently is not medically stable to d/c.      Medications:  Scheduled: . amLODipine  5 mg Oral Daily  . apixaban  5 mg Oral BID  . aspirin EC  81 mg Oral Daily  . folic acid  1 mg Oral BID  . [START ON 10/13/2019] methotrexate  10 mg Oral Q Sun  . oxybutynin  5 mg Oral BID  . pantoprazole   40 mg Oral Daily  . predniSONE  10 mg Oral Q breakfast  . propranolol  10 mg Oral TID  . sertraline  50 mg Oral Daily  . trimethoprim-polymyxin b  2 drop Both Eyes Q6H   Continuous: . azithromycin 500 mg (10/07/19 2215)  . cefTRIAXone (ROCEPHIN)  IV     PRN:   Objective:  Vital Signs  Vitals:   10/07/19 1721 10/07/19 1832 10/07/19 2253 10/08/19 0630  BP: (!) 188/50 (!) 143/50 (!) 193/159 (!) 175/62  Pulse: 60 66 82 63  Resp: 16 16 16 16   Temp: 99 F (37.2 C) 98.1 F (36.7 C) 98.4 F (36.9 C) 98.3 F (36.8 C)  TempSrc: Oral Oral Oral Oral  SpO2: 99% 97% 98% 96%  Weight:      Height:        Intake/Output Summary (Last 24 hours) at 10/08/2019 1042 Last data filed at 10/08/2019 0600 Gross per 24 hour  Intake 1246.9 ml  Output 1100 ml  Net 146.9 ml   Filed Weights   10/06/19 1851  Weight: 57.2 kg    General appearance: Awake alert.  In no distress Resp: Good air entry bilaterally.  Crackles bilateral bases.  Normal effort.  No wheezing or rhonchi.   Cardio: S1-S2 is normal regular.  No S3-S4.  No rubs murmurs or bruit GI: Abdomen is soft.  Nontender nondistended.  Bowel sounds are present normal.  No masses organomegaly Extremities: No edema.  Moving all extremities Neurologic:  No focal neurological deficits.    Lab Results:  Data Reviewed: I have personally reviewed following labs and imaging studies  CBC: Recent Labs  Lab 10/06/19 1858 10/06/19 1904 10/08/19 0755  WBC 10.3  --  7.9  HGB 10.6* 9.5* 10.3*  HCT 33.6* 28.0* 33.0*  MCV 92.6  --  90.7  PLT 220  --  277    Basic Metabolic Panel: Recent Labs  Lab 10/06/19 1858 10/06/19 1904 10/07/19 0528 10/08/19 0347  NA 139 139 140 141  K 3.7 3.7 3.8 3.9  CL 106 105 108 111  CO2 24  --  23 21*  GLUCOSE 117* 109* 111* 105*  BUN 24* 23 17 17   CREATININE 1.14* 1.10* 1.05* 1.10*  CALCIUM 8.8*  --  8.3* 8.8*    GFR: Estimated Creatinine Clearance: 29.4 mL/min (A) (by C-G formula based on SCr  of 1.1 mg/dL (H)).  Liver Function Tests: Recent Labs  Lab 10/06/19 1858 10/07/19 0528  AST 18 16  ALT 16 13  ALKPHOS 52 50  BILITOT 0.8 0.9  PROT 5.7* 4.7*  ALBUMIN 2.9* 2.5*    Coagulation Profile: Recent Labs  Lab 10/06/19 1858  INR 1.3*     Recent Results (from the past 240 hour(s))  SARS Coronavirus 2 by RT PCR (hospital order, performed in [facility] hospital lab) Nasopharyngeal Nasopharyngeal  Swab     Status: None   Collection Time: 10/06/19  7:02 PM   Specimen: Nasopharyngeal Swab  Result Value Ref Range Status   SARS Coronavirus 2 NEGATIVE NEGATIVE Final    Comment: (NOTE) SARS-CoV-2 target nucleic acids are NOT DETECTED.  The SARS-CoV-2 RNA is generally detectable in upper and lower respiratory specimens during the acute phase of infection. The lowest concentration of SARS-CoV-2 viral copies this assay can detect is 250 copies / mL. A negative result does not preclude SARS-CoV-2 infection and should not be used as the sole basis for treatment or other patient management decisions.  A negative result may occur with improper specimen collection / handling, submission of specimen other than nasopharyngeal swab, presence of viral mutation(s) within the areas targeted by this assay, and inadequate number of viral copies (<250 copies / mL). A negative result must be combined with clinical observations, patient history, and epidemiological information.  Fact Sheet for Patients:   StrictlyIdeas.no  Fact Sheet for Healthcare Providers: BankingDealers.co.za  This test is not yet approved or  cleared by the [place] FDA and has been authorized for detection and/or diagnosis of SARS-CoV-2 by FDA under an Emergency Use Authorization (EUA).  This EUA will remain in effect (meaning this test can be used) for the duration of the COVID-19 declaration under Section 564(b)(1) of the Act, 21 U.S.C. section  360bbb-3(b)(1), unless the authorization is terminated or revoked sooner.  Performed at [facility], [place] [address], [place], [place] [address]   Urine culture     Status: Abnormal   Collection Time: 10/06/19  7:05 PM   Specimen: In/Out Cath Urine  Result Value Ref Range Status   Specimen Description IN/OUT CATH URINE  Final   Special Requests   Final    NONE Performed at [facility], [place] [address] [place], [place] [address]    Culture MULTIPLE SPECIES PRESENT, SUGGEST RECOLLECTION (A)  Final   Report Status 10/07/2019 FINAL  Final  Blood culture (routine x 2)     Status: None (Preliminary result)   Collection Time: 10/06/19  9:28 PM   Specimen: BLOOD  Result Value Ref Range Status   Specimen Description BLOOD LEFT ANTECUBITAL  Final   Special Requests   Final    BOTTLES DRAWN AEROBIC AND ANAEROBIC Blood Culture adequate volume   Culture   Final    NO GROWTH 2 DAYS Performed at [facility], [place] [address], [place], [place] [address]    Report Status PENDING  Incomplete  Blood culture (routine x 2)     Status: None (Preliminary result)   Collection Time: 10/07/19 12:51 AM   Specimen: BLOOD RIGHT HAND  Result Value Ref Range Status   Specimen Description BLOOD RIGHT HAND  Final   Special Requests   Final    BOTTLES DRAWN AEROBIC AND ANAEROBIC Blood Culture adequate volume   Culture   Final    NO GROWTH 1 DAY Performed at [facility], [place] [address] [place], [place] [address]    Report Status PENDING  Incomplete      Radiology Studies: CT Head Wo Contrast  Result Date: 10/06/2019 CLINICAL DATA:  Status post trauma. EXAM: CT HEAD WITHOUT CONTRAST TECHNIQUE: Contiguous axial images were obtained from the base of the skull through the vertex without intravenous contrast. COMPARISON:  October 04, 2018 FINDINGS: Brain: There is mild cerebral atrophy with widening of the extra-axial spaces and ventricular dilatation. There are areas  of decreased  attenuation within the white matter tracts of the supratentorial brain, consistent with microvascular disease changes. Vascular: No hyperdense vessel or unexpected calcification. Skull: Normal. Negative for fracture or focal lesion. Sinuses/Orbits: No acute finding. Other: Mild left frontal parietal scalp soft tissue swelling is seen. IMPRESSION: Mild left frontal parietal scalp soft tissue swelling, without evidence of acute fracture or acute intracranial abnormality. Electronically Signed   By: Virgina Norfolk M.D.   On: 10/06/2019 20:15   CT CHEST W CONTRAST  Result Date: 10/07/2019 CLINICAL DATA:  [place], fever, abnormal chest x-ray EXAM: CT CHEST WITH CONTRAST TECHNIQUE: Multidetector CT imaging of the chest was performed during intravenous contrast administration. CONTRAST:  4mL OMNIPAQUE IOHEXOL 300 MG/ML  SOLN COMPARISON:  11/18/2015, 10/06/2019 FINDINGS: Cardiovascular: The heart is mildly enlarged without pericardial effusion. Normal caliber of the thoracic aorta, with no evidence of dissection. There is extensive atherosclerosis throughout the thoracic aorta and coronary vasculature. Mediastinum/Nodes: No enlarged mediastinal, hilar, or axillary lymph nodes. Thyroid gland, trachea, and esophagus demonstrate no significant findings. Lungs/Pleura: Multifocal areas of consolidation are identified, most pronounced within the mid to upper lung zones. The consolidation within the upper lobes has a somewhat linear configuration, and may reflect atelectasis or scarring. Consolidation within the superior segment left lower lobe is most consistent with pneumonia. There are trace bilateral pleural effusions, left greater than right. No pneumothorax. The central airways are patent. Upper Abdomen: No acute abnormality. Musculoskeletal: There are no acute displaced fractures. Reconstructed images demonstrate no additional findings. IMPRESSION: 1. Left lower lobe consolidation most compatible with pneumonia. 2.  Bilateral upper lobe linear consolidation most compatible with scarring or atelectasis. 3. Trace bilateral pleural effusions, left greater than right. 4.  Aortic Atherosclerosis (ICD10-I70.0). Electronically Signed   By: Randa Ngo M.D.   On: 10/07/2019 03:02   CT Cervical Spine Wo Contrast  Result Date: 10/06/2019 CLINICAL DATA:  Facial trauma, posterior scalp hematoma, fell, anticoagulated EXAM: CT CERVICAL SPINE WITHOUT CONTRAST TECHNIQUE: Multidetector CT imaging of the cervical spine was performed without intravenous contrast. Multiplanar CT image reconstructions were also generated. COMPARISON:  08/18/2017 FINDINGS: Alignment: There is right convex scoliosis of the cervical spine. Mild anterolisthesis of C4 on C5 is noted, likely due to extensive spondylosis and facet hypertrophy. Skull base and vertebrae: There are no acute displaced cervical spine fractures. Soft tissues and spinal canal: No prevertebral fluid or swelling. No visible canal hematoma. Disc levels: Severe hypertrophic changes are seen at the C1/C2 interface, right greater than left. There is bony fusion across the facet joints from C2 through C4. Severe facet hypertrophy is noted at C4/C5 and C5/C6. The left C6/C7 facets are fused. There is extensive multilevel spondylosis, most pronounced at the C4/C5 level. There is bilateral neural foraminal encroachment at C4/C5 lateralizing to the right, and symmetrically at C5/C6. Left predominant neural foraminal narrowing is seen at C6/C7. Upper chest: Airway is patent. Biapical pleural and parenchymal scarring, as well as calcified pleural plaques, are noted. Other: Reconstructed images demonstrate no additional findings. IMPRESSION: 1. No evidence of cervical spine fracture. 2. Extensive multilevel cervical degenerative changes as above. Electronically Signed   By: Randa Ngo M.D.   On: 10/06/2019 20:16   MR BRAIN W WO CONTRAST  Result Date: 10/07/2019 CLINICAL DATA:  Metastatic  disease evaluation; history of melanoma to the forehead. Fall.  Asymmetric pupils. EXAM: MRI HEAD WITHOUT AND WITH CONTRAST TECHNIQUE: Multiplanar, multiecho pulse sequences of the brain and surrounding structures were obtained without and with intravenous contrast.  CONTRAST:  5.63mL GADAVIST GADOBUTROL 1 MMOL/ML IV SOLN COMPARISON:  Head CT from yesterday FINDINGS: Brain: No acute infarction, hemorrhage, hydrocephalus, extra-axial collection or mass lesion. A small nodular appearing focus along the left frontal convexity on coronal postcontrast imaging has a vein like appearance on axial thin slices. Chronic small vessel ischemia with confluent periventricular and pontine gliosis. Small remote cerebellar infarcts. Cerebral volume loss which is generalized Vascular: Normal flow voids and vascular enhancements Skull and upper cervical spine: Thinning and increased enhancement along the skin surface of the central forehead. No underlying marrow signal abnormality. C2-3 and C3-4 fusion with C4-5 advanced disc and facet degeneration leading to anterolisthesis. Small scalp hematoma laterally on the left. Sinuses/Orbits: No evidence of injury. Bilateral cataract resection. IMPRESSION: 1. No acute intracranial finding.  No evidence of brain metastasis. 2. Postoperative forehead without underlying bony abnormality. Left temporal scalp hematoma. 3. Chronic small vessel ischemia. Electronically Signed   By: Monte Fantasia M.D.   On: 10/07/2019 04:26   DG Pelvis Portable  Result Date: 10/06/2019 CLINICAL DATA:  86 year old post fall. EXAM: PORTABLE PELVIS 1-2 VIEWS COMPARISON:  None. FINDINGS: The cortical margins of the bony pelvis are intact. No fracture. Pubic symphysis and sacroiliac joints are congruent. Both femoral heads are well-seated in the respective acetabula. Degenerative change of the sacroiliac joints, hips, and pubic symphysis. Advanced vascular calcifications. IMPRESSION: No pelvic fracture. Electronically  Signed   By: Keith Rake M.D.   On: 10/06/2019 19:26   DG Chest Port 1 View  Result Date: 10/06/2019 CLINICAL DATA:  86 year old post fall at home. EXAM: PORTABLE CHEST 1 VIEW COMPARISON:  Radiograph 10/04/2018 FINDINGS: Stable heart size and mediastinal contours. Aortic atherosclerosis. No pneumothorax. There is biapical pleuroparenchymal scarring. Ill-defined opacities in the periphery of the left greater than right mid lung. No significant pleural effusion. Chronic change of the left shoulder. No acute osseous abnormalities are seen. IMPRESSION: Ill-defined opacities in the periphery of the left greater than right mid lung, indeterminate for pulmonary contusion versus infection in the setting of fall. Aortic Atherosclerosis (ICD10-I70.0). Electronically Signed   By: Keith Rake M.D.   On: 10/06/2019 19:25   DG Shoulder Left  Result Date: 10/06/2019 CLINICAL DATA:  Left shoulder pain. EXAM: LEFT SHOULDER - 2+ VIEW COMPARISON:  June 06, 2019 (1:35 p.m.) FINDINGS: There is no evidence of an acute fracture or dislocation. Mild degenerative changes seen involving the left acromioclavicular joint and left glenohumeral articulation. Moderate severity chronic changes are noted along the inferior aspect of the left glenoid. An 8 mm well-defined area of soft tissue calcification is seen just below the left glenoid. This is seen on the prior study. Mild soft tissue swelling is seen above the left acromioclavicular joint. IMPRESSION: 1. Degenerative changes, as described above, without evidence of acute fracture or dislocation. 2. Soft tissue swelling above the left acromioclavicular joint. Electronically Signed   By: Virgina Norfolk M.D.   On: 10/06/2019 19:55   ECHOCARDIOGRAM COMPLETE  Result Date: 10/07/2019    ECHOCARDIOGRAM REPORT   Patient Name:   Renee Alvarado Date of Exam: 10/07/2019 Medical Rec #:  [id number]   Height:       64.0 in Accession #:    [id number]  Weight:       126.0 lb Date of  Birth:  Nov 23, 1926   BSA:          1.608 m Patient Age:    53 years    BP:  161/54 mmHg Patient Gender: F           HR:           74 bpm. Exam Location:  Inpatient Procedure: 2D Echo, Cardiac Doppler and Color Doppler Indications:    Syncope 780.2 / R55  History:        Patient has no prior history of Echocardiogram examinations.                 Risk Factors:Hypertension and GERD.  Sonographer:    Bernadene Person RDCS Referring Phys: [id number] [place]  1. Left ventricular ejection fraction, by estimation, is 65 to 70%. The left ventricle has normal function. The left ventricle has no regional wall motion abnormalities. There is moderate left ventricular hypertrophy. Left ventricular diastolic parameters are consistent with Grade I diastolic dysfunction (impaired relaxation). Elevated left ventricular end-diastolic pressure.  2. Right ventricular systolic function is normal. The right ventricular size is normal. There is normal pulmonary artery systolic pressure. The estimated right ventricular systolic pressure is 61.4 mmHg.  3. The mitral valve is abnormal. Trivial mitral valve regurgitation.  4. The aortic valve is tricuspid. Aortic valve regurgitation is not visualized. Mild aortic valve sclerosis is present, with no evidence of aortic valve stenosis.  5. The inferior vena cava is normal in size with greater than 50% respiratory variability, suggesting right atrial pressure of 3 mmHg. FINDINGS  Left Ventricle: Left ventricular ejection fraction, by estimation, is 65 to 70%. The left ventricle has normal function. The left ventricle has no regional wall motion abnormalities. The left ventricular internal cavity size was normal in size. There is  moderate left ventricular hypertrophy. Left ventricular diastolic parameters are consistent with Grade I diastolic dysfunction (impaired relaxation). Elevated left ventricular end-diastolic pressure. Right Ventricle: The right ventricular size  is normal. No increase in right ventricular wall thickness. Right ventricular systolic function is normal. There is normal pulmonary artery systolic pressure. The tricuspid regurgitant velocity is 2.76 m/s, and  with an assumed right atrial pressure of 3 mmHg, the estimated right ventricular systolic pressure is 43.1 mmHg. Left Atrium: Left atrial size was normal in size. Right Atrium: Right atrial size was normal in size. Pericardium: There is no evidence of pericardial effusion. Mitral Valve: The mitral valve is abnormal. There is moderate calcification of the anterior mitral valve leaflet(s). Trivial mitral valve regurgitation. Tricuspid Valve: The tricuspid valve is grossly normal. Tricuspid valve regurgitation is mild. Aortic Valve: The aortic valve is tricuspid. Aortic valve regurgitation is not visualized. Mild aortic valve sclerosis is present, with no evidence of aortic valve stenosis. Pulmonic Valve: The pulmonic valve was normal in structure. Pulmonic valve regurgitation is not visualized. Aorta: The aortic root and ascending aorta are structurally normal, with no evidence of dilitation. Venous: The inferior vena cava is normal in size with greater than 50% respiratory variability, suggesting right atrial pressure of 3 mmHg. IAS/Shunts: No atrial level shunt detected by color flow Doppler.  LEFT VENTRICLE PLAX 2D LVIDd:         4.20 cm  Diastology LVIDs:         2.65 cm  LV e' lateral:   7.01 cm/s LV PW:         1.30 cm  LV E/e' lateral: 16.8 LV IVS:        1.30 cm  LV e' medial:    5.22 cm/s LVOT diam:     1.80 cm  LV E/e' medial:  22.6 LV SV:  68 LV SV Index:   42 LVOT Area:     2.54 cm  RIGHT VENTRICLE RV S prime:     12.00 cm/s TAPSE (M-mode): 2.2 cm LEFT ATRIUM             Index       RIGHT ATRIUM           Index LA diam:        4.20 cm 2.61 cm/m  RA Area:     17.40 cm LA Vol (A2C):   50.9 ml 31.66 ml/m RA Volume:   44.10 ml  27.43 ml/m LA Vol (A4C):   52.5 ml 32.66 ml/m LA Biplane Vol:  51.8 ml 32.22 ml/m  AORTIC VALVE LVOT Vmax:   114.00 cm/s LVOT Vmean:  79.400 cm/s LVOT VTI:    0.267 m  AORTA Ao Root diam: 2.70 cm Ao Asc diam:  2.90 cm MITRAL VALVE                TRICUSPID VALVE MV Area (PHT): 4.89 cm     TR Peak grad:   30.5 mmHg MV Decel Time: 155 msec     TR Vmax:        276.00 cm/s MV E velocity: 118.00 cm/s MV A velocity: 127.00 cm/s  SHUNTS MV E/A ratio:  0.93         Systemic VTI:  0.27 m                             Systemic Diam: 1.80 cm Lyman Bishop MD Electronically signed by Lyman Bishop MD Signature Date/Time: 10/07/2019/10:30:41 AM    Final        LOS: 1 day   [facility] Pager on [email or web address]  10/08/2019, 10:42 AM

## 2019-10-08 NOTE — Progress Notes (Signed)
Physical Therapy Treatment Patient Details Name: Renee Alvarado MRN: [id number] DOB: 07-Aug-1926 Today's Date: 10/08/2019    History of Present Illness Pt is a 86 y/o female admitted secondary to CAP and frequent falls. PMH includes DVT, HTN, polymayalgia rheumatica and SLE.     PT Comments    Pt received in bed, agreeable to participation in therapy. She required min assist bed mobility, min assist sit to stand with RW, and mod assist ambulation 5' with RW. She presents with unsteady gait and decreased acitivity tolerance. Pt in recliner at end of session. Pt has good support at home from her daughter. She also has a rollator and wheelchair at home. PT to continue to address strengthening, activity tolerance and balance.     Follow Up Recommendations  Home health PT;Supervision/Assistance - 24 hour     Equipment Recommendations  None recommended by PT    Recommendations for Other Services       Precautions / Restrictions Precautions Precautions: Fall Restrictions Weight Bearing Restrictions: No    Mobility  Bed Mobility Overal bed mobility: Needs Assistance Bed Mobility: Supine to Sit     Supine to sit: Min assist;HOB elevated     General bed mobility comments: +rail, increased time  Transfers Overall transfer level: Needs assistance Equipment used: Rolling walker (2 wheeled) Transfers: Sit to/from Stand Sit to Stand: Min assist         General transfer comment: cues for hand placement, assist to power up  Ambulation/Gait Ambulation/Gait assistance: Mod assist Gait Distance (Feet): 5 Feet Assistive device: Rolling walker (2 wheeled) Gait Pattern/deviations: Step-through pattern;Decreased stride length;Trunk flexed Gait velocity: decreased   General Gait Details: Amb with RW bed to recliner. Assist to maintain balance and manage RW. Pt fatigues quickly.   Stairs             Wheelchair Mobility    Modified Rankin (Stroke Patients Only)        Balance Overall balance assessment: Needs assistance Sitting-balance support: No upper extremity supported;Feet supported Sitting balance-Leahy Scale: Fair     Standing balance support: Bilateral upper extremity supported;During functional activity Standing balance-Leahy Scale: Poor Standing balance comment: reliant on external support                            Cognition Arousal/Alertness: Awake/alert Behavior During Therapy: WFL for tasks assessed/performed Overall Cognitive Status: Within Functional Limits for tasks assessed                                        Exercises      General Comments General comments (skin integrity, edema, etc.): VSS on RA      Pertinent Vitals/Pain Pain Assessment: Faces Faces Pain Scale: Hurts a little bit Pain Location: low back Pain Descriptors / Indicators: Discomfort Pain Intervention(s): Monitored during session;Repositioned    Home Living                      Prior Function            PT Goals (current goals can now be found in the care plan section) Acute Rehab PT Goals Patient Stated Goal: to go home Progress towards PT goals: Progressing toward goals    Frequency    Min 3X/week      PT Plan Current plan remains appropriate    Co-evaluation  AM-PAC PT "6 Clicks" Mobility   Outcome Measure  Help needed turning from your back to your side while in a flat bed without using bedrails?: A Little Help needed moving from lying on your back to sitting on the side of a flat bed without using bedrails?: A Lot Help needed moving to and from a bed to a chair (including a wheelchair)?: A Lot Help needed standing up from a chair using your arms (e.g., wheelchair or bedside chair)?: A Little Help needed to walk in hospital room?: A Lot Help needed climbing 3-5 steps with a railing? : A Lot 6 Click Score: 14    End of Session Equipment Utilized During Treatment: Gait  belt Activity Tolerance: Patient tolerated treatment well Patient left: in chair;with call bell/phone within reach;with chair alarm set Nurse Communication: Mobility status PT Visit Diagnosis: Unsteadiness on feet (R26.81);Muscle weakness (generalized) (M62.81)     Time: 4825-0037 PT Time Calculation (min) (ACUTE ONLY): 24 min  Charges:  $Gait Training: 23-37 mins                     Lorrin Goodell, [place]  Office # [phone] Pager [phone]    Lorriane Shire 10/08/2019, 11:15 AM

## 2019-10-08 NOTE — TOC Initial Note (Addendum)
Transition of Care [facility]) - Initial/Assessment Note    Patient Details  Name: Renee Alvarado MRN: [id number] Date of Birth: 06-27-1926  [facility]) CM/SW Contact:    Renee Favre, RN Phone Number: 10/08/2019, 10:36 AM  Clinical Narrative:                  Patient from home with daughter. Discussed PT recommendations for HHPT and 24 hr supervision. Per PT note daughter can provide supervision.   Provided medicare.gov list of home health agencies. Daughter coming to visit at noon. Will return to speak to patient and daughter  1240 returned to room. Patient and daughter in law Renee Alvarado at bedside. Discussed home health PT, patient declining home health at this time. NCM asked if she wanted NCM to call daughter Renee Alvarado, she declined.  Expected Discharge Plan: [place] Barriers to Discharge: Continued Medical Work up   Patient Goals and CMS Choice Patient states their goals for this hospitalization and ongoing recovery are:: to return to home CMS Medicare.gov Compare Post Acute Care list provided to:: Patient Choice offered to / list presented to : Patient  Expected Discharge Plan and Services Expected Discharge Plan: [place]   Discharge Planning Services: CM Consult Post Acute Care Choice: [place] arrangements for the past 2 months: Single Family Home                   DME Agency: NA       HH Arranged: PT          Prior Living Arrangements/Services Living arrangements for the past 2 months: Single Family Home Lives with:: Adult Children   Do you feel safe going back to the place where you live?: Yes      Need for Family Participation in Patient Care: Yes (Comment) Care giver support system in place?: Yes (comment) Current home services: DME Criminal Activity/Legal Involvement Pertinent to Current Situation/Hospitalization: No - Comment as needed  Activities of Daily Living      Permission Sought/Granted    Permission granted to share information with : Yes, Verbal Permission Granted  Share Information with NAME: daughter Mertie Moores           Emotional Assessment Appearance:: Appears stated age       Alcohol / Substance Use: Not Applicable Psych Involvement: No (comment)  Admission diagnosis:  CAP (community acquired pneumonia) [J18.9] Fall at home [W19.XXXA, Y30.160] Pneumonia of both lungs due to infectious organism [J18.9] Community acquired pneumonia of left lower lobe of lung [J18.9] Patient Active Problem List   Diagnosis Date Noted  . Frequent falls 10/07/2019  . History of DVT (deep vein thrombosis) 10/07/2019  . Melanoma of forehead ([place]) 10/07/2019  . Systemic lupus erythematosus ([place]) 10/07/2019  . Polymyalgia rheumatica ([place]) 10/07/2019  . Acute bacterial conjunctivitis of both eyes 10/07/2019  . Essential hypertension 10/07/2019  . GERD without esophagitis 10/07/2019  . CAP (community acquired pneumonia) 10/07/2019  . Pneumonia of both lungs due to infectious organism 10/06/2019   PCP:  Patient, No Pcp Per Pharmacy:   [facility] [address] [place] [address] Phone: [phone] Fax: [phone]     Social Determinants of Health (SDOH) Interventions    Readmission Risk Interventions No flowsheet data found.

## 2019-10-09 LAB — CBC
HCT: 32.1 % — ABNORMAL LOW (ref 36.0–46.0)
Hemoglobin: 9.9 g/dL — ABNORMAL LOW (ref 12.0–15.0)
MCH: 27.9 pg (ref 26.0–34.0)
MCHC: 30.8 g/dL (ref 30.0–36.0)
MCV: 90.4 fL (ref 80.0–100.0)
Platelets: 245 10*3/uL (ref 150–400)
RBC: 3.55 MIL/uL — ABNORMAL LOW (ref 3.87–5.11)
RDW: 15.3 % (ref 11.5–15.5)
WBC: 7.3 10*3/uL (ref 4.0–10.5)
nRBC: 0 % (ref 0.0–0.2)

## 2019-10-09 LAB — BASIC METABOLIC PANEL
Anion gap: 10 (ref 5–15)
BUN: 13 mg/dL (ref 8–23)
CO2: 23 mmol/L (ref 22–32)
Calcium: 8.7 mg/dL — ABNORMAL LOW (ref 8.9–10.3)
Chloride: 109 mmol/L (ref 98–111)
Creatinine, Ser: 1.1 mg/dL — ABNORMAL HIGH (ref 0.44–1.00)
GFR calc Af Amer: 51 mL/min — ABNORMAL LOW (ref >60)
GFR calc non Af Amer: 44 mL/min — ABNORMAL LOW (ref >60)
Glucose, Bld: 87 mg/dL (ref 70–99)
Potassium: 3.9 mmol/L (ref 3.5–5.1)
Sodium: 142 mmol/L (ref 135–145)

## 2019-10-09 NOTE — Progress Notes (Signed)
PROGRESS NOTE    Renee Alvarado  WGN:[id number] DOB: October 30, 1926 DOA: 10/06/2019 PCP: Patient, No Pcp Per   Brief Narrative: Renee Alvarado is a 86 y.o. female with a past medical history of melanoma removal of lesions from her forehead June, previous history of DVT on lifelong Eliquis, history of SLE, polymyalgia rheumatica, essential hypertension, osteoporosis. Patient presented secondary to an unwitnessed fall and found to have pneumonia of bilateral lungs. Empiric antibiotics initiated.  Assessment & Plan:   Principal Problem:   Pneumonia of both lungs due to infectious organism Active Problems:   Frequent falls   History of DVT (deep vein thrombosis)   Melanoma of forehead (HCC)   Systemic lupus erythematosus (HCC)   Polymyalgia rheumatica (HCC)   Acute bacterial conjunctivitis of both eyes   Essential hypertension   GERD without esophagitis   CAP (community acquired pneumonia)   Community acquired pneumonia Bilateral lungs. Started empirically on Ceftriaxone and azithromycin.  -Continue Ceftriaxone/Azithromycin  Frequent falls PT recommending home health PT  History of DVT -Continue Eliquis  SLE Polymyalgia rheumatica -Continue methotrexate and prednisone  Acute bacterial conjunctivitis -Continue Polytrim; appears to be improved  Essential hypertension -Continue propranolol and amlodipine  CKD stage IIIa Stable.  Normocytic anemia Stable.  History of melanoma Patient followed as an outpatient. Recent excisional biopsy.  GERD -Continue Protonix    DVT prophylaxis: Eliquis Code Status:   Code Status: DNR Family Communication: Daughter on telephone Disposition Plan: Discharge home tomorrow   Consultants:   None  Procedures:   None  Antimicrobials:  Ceftriaxone  Azithromycin    Subjective: Breathing better than previous. Almost at baseline.  Objective: Vitals:   10/08/19 2208 10/09/19 0609 10/09/19 0845 10/09/19 1215  BP: (!) 169/52  (!) 160/64 (!) 140/93 (!) 142/61  Pulse: 63 72 72 64  Resp: 16 16  16   Temp:  98.5 F (36.9 C)  99.1 F (37.3 C)  TempSrc: Oral Oral  Oral  SpO2: 96% 97%  97%  Weight:      Height:        Intake/Output Summary (Last 24 hours) at 10/09/2019 1634 Last data filed at 10/08/2019 2200 Gross per 24 hour  Intake 365 ml  Output --  Net 365 ml   Filed Weights   10/06/19 1851  Weight: 57.2 kg    Examination:  General exam: Appears calm and comfortable Respiratory system: mild rales. Respiratory effort normal. Cardiovascular system: S1 & S2 heard, RRR. No murmurs, rubs, gallops or clicks. Gastrointestinal system: Abdomen is nondistended, soft and nontender. No organomegaly or masses felt. Normal bowel sounds heard. Central nervous system: Alert and oriented. No focal neurological deficits. Musculoskeletal: No edema. No calf tenderness Skin: No cyanosis. No rashes Psychiatry: Judgement and insight appear normal. Mood & affect appropriate.     Data Reviewed: I have personally reviewed following labs and imaging studies  CBC Lab Results  Component Value Date   WBC 7.3 10/09/2019   RBC 3.55 (L) 10/09/2019   HGB 9.9 (L) 10/09/2019   HCT 32.1 (L) 10/09/2019   MCV 90.4 10/09/2019   MCH 27.9 10/09/2019   PLT 245 10/09/2019   MCHC 30.8 10/09/2019   RDW 15.3 78/46/9629     Last metabolic panel Lab Results  Component Value Date   NA 142 10/09/2019   K 3.9 10/09/2019   CL 109 10/09/2019   CO2 23 10/09/2019   BUN 13 10/09/2019   CREATININE 1.10 (H) 10/09/2019   GLUCOSE 87 10/09/2019   GFRNONAA 44 (L)  10/09/2019   GFRAA 51 (L) 10/09/2019   CALCIUM 8.7 (L) 10/09/2019   PROT 4.7 (L) 10/07/2019   ALBUMIN 2.5 (L) 10/07/2019   BILITOT 0.9 10/07/2019   ALKPHOS 50 10/07/2019   AST 16 10/07/2019   ALT 13 10/07/2019   ANIONGAP 10 10/09/2019    CBG (last 3)  No results for input(s): GLUCAP in the last 72 hours.   GFR: Estimated Creatinine Clearance: 29.4 mL/min (A) (by C-G  formula based on SCr of 1.1 mg/dL (H)).  Coagulation Profile: Recent Labs  Lab 10/06/19 1858  INR 1.3*    Recent Results (from the past 240 hour(s))  SARS Coronavirus 2 by RT PCR (hospital order, performed in [facility] hospital lab) Nasopharyngeal Nasopharyngeal Swab     Status: None   Collection Time: 10/06/19  7:02 PM   Specimen: Nasopharyngeal Swab  Result Value Ref Range Status   SARS Coronavirus 2 NEGATIVE NEGATIVE Final    Comment: (NOTE) SARS-CoV-2 target nucleic acids are NOT DETECTED.  The SARS-CoV-2 RNA is generally detectable in upper and lower respiratory specimens during the acute phase of infection. The lowest concentration of SARS-CoV-2 viral copies this assay can detect is 250 copies / mL. A negative result does not preclude SARS-CoV-2 infection and should not be used as the sole basis for treatment or other patient management decisions.  A negative result may occur with improper specimen collection / handling, submission of specimen other than nasopharyngeal swab, presence of viral mutation(s) within the areas targeted by this assay, and inadequate number of viral copies (<250 copies / mL). A negative result must be combined with clinical observations, patient history, and epidemiological information.  Fact Sheet for Patients:   StrictlyIdeas.no  Fact Sheet for Healthcare Providers: BankingDealers.co.za  This test is not yet approved or  cleared by the [place] FDA and has been authorized for detection and/or diagnosis of SARS-CoV-2 by FDA under an Emergency Use Authorization (EUA).  This EUA will remain in effect (meaning this test can be used) for the duration of the COVID-19 declaration under Section 564(b)(1) of the Act, 21 U.S.C. section 360bbb-3(b)(1), unless the authorization is terminated or revoked sooner.  Performed at [facility], [place] [address], [place], [place] [address]   Urine  culture     Status: Abnormal   Collection Time: 10/06/19  7:05 PM   Specimen: In/Out Cath Urine  Result Value Ref Range Status   Specimen Description IN/OUT CATH URINE  Final   Special Requests   Final    NONE Performed at [facility], [place] [address] [place], [place] [address]    Culture MULTIPLE SPECIES PRESENT, SUGGEST RECOLLECTION (A)  Final   Report Status 10/07/2019 FINAL  Final  Blood culture (routine x 2)     Status: None (Preliminary result)   Collection Time: 10/06/19  9:28 PM   Specimen: BLOOD  Result Value Ref Range Status   Specimen Description BLOOD LEFT ANTECUBITAL  Final   Special Requests   Final    BOTTLES DRAWN AEROBIC AND ANAEROBIC Blood Culture adequate volume   Culture   Final    NO GROWTH 3 DAYS Performed at [facility], [place] [address] [place], [place] [address]    Report Status PENDING  Incomplete  Blood culture (routine x 2)     Status: None (Preliminary result)   Collection Time: 10/07/19 12:51 AM   Specimen: BLOOD RIGHT HAND  Result Value Ref Range Status   Specimen Description  BLOOD RIGHT HAND  Final   Special Requests   Final    BOTTLES DRAWN AEROBIC AND ANAEROBIC Blood Culture adequate volume   Culture   Final    NO GROWTH 2 DAYS Performed at [facility], [address], [place], [place] [address]    Report Status PENDING  Incomplete        Radiology Studies: No results found.      Scheduled Meds: . amLODipine  5 mg Oral Daily  . apixaban  5 mg Oral BID  . aspirin EC  81 mg Oral Daily  . azithromycin  500 mg Oral QHS  . folic acid  1 mg Oral BID  . [START ON 10/13/2019] methotrexate  10 mg Oral Q Sun  . oxybutynin  5 mg Oral BID  . pantoprazole  40 mg Oral Daily  . predniSONE  10 mg Oral Q breakfast  . propranolol  10 mg Oral TID  . sertraline  50 mg Oral Daily  . trimethoprim-polymyxin b  2 drop Both Eyes Q6H   Continuous Infusions: . cefTRIAXone (ROCEPHIN)  IV 1 g (10/08/19 2026)     LOS: 2 days      Cordelia Poche, MD [facility] 10/09/2019, 4:34 PM  If 7PM-7AM, please contact night-coverage [email or web address]

## 2019-10-10 LAB — TSH: TSH: 2.91 u[IU]/mL (ref 0.350–4.500)

## 2019-10-10 LAB — CBC
HCT: 33.3 % — ABNORMAL LOW (ref 36.0–46.0)
Hemoglobin: 10.3 g/dL — ABNORMAL LOW (ref 12.0–15.0)
MCH: 28.4 pg (ref 26.0–34.0)
MCHC: 30.9 g/dL (ref 30.0–36.0)
MCV: 91.7 fL (ref 80.0–100.0)
Platelets: 248 10*3/uL (ref 150–400)
RBC: 3.63 MIL/uL — ABNORMAL LOW (ref 3.87–5.11)
RDW: 15.5 % (ref 11.5–15.5)
WBC: 6.7 10*3/uL (ref 4.0–10.5)
nRBC: 0 % (ref 0.0–0.2)

## 2019-10-10 LAB — BASIC METABOLIC PANEL
Anion gap: 8 (ref 5–15)
BUN: 14 mg/dL (ref 8–23)
CO2: 25 mmol/L (ref 22–32)
Calcium: 8.8 mg/dL — ABNORMAL LOW (ref 8.9–10.3)
Chloride: 110 mmol/L (ref 98–111)
Creatinine, Ser: 1.12 mg/dL — ABNORMAL HIGH (ref 0.44–1.00)
GFR calc Af Amer: 50 mL/min — ABNORMAL LOW (ref >60)
GFR calc non Af Amer: 43 mL/min — ABNORMAL LOW (ref >60)
Glucose, Bld: 88 mg/dL (ref 70–99)
Potassium: 4 mmol/L (ref 3.5–5.1)
Sodium: 143 mmol/L (ref 135–145)

## 2019-10-10 LAB — TROPONIN I (HIGH SENSITIVITY): Troponin I (High Sensitivity): 21 ng/L — ABNORMAL HIGH (ref <18)

## 2019-10-10 LAB — MAGNESIUM: Magnesium: 2.1 mg/dL (ref 1.7–2.4)

## 2019-10-10 MED ORDER — AMLODIPINE BESYLATE 10 MG PO TABS: 10.0000 mg | ORAL_TABLET | Freq: Every day | ORAL | 0 refills | Status: DC

## 2019-10-10 MED ORDER — CEFDINIR 300 MG PO CAPS: 300.0000 mg | ORAL_CAPSULE | Freq: Every day | ORAL | 0 refills | Status: AC

## 2019-10-10 MED ORDER — SODIUM CHLORIDE 0.9 % IV SOLN
1.0000 g | Freq: Once | INTRAVENOUS | Status: AC
  Administered 2019-10-10: 1 g via INTRAVENOUS
  Filled 2019-10-10: qty 1

## 2019-10-10 MED ORDER — POLYMYXIN B-TRIMETHOPRIM 10000-0.1 UNIT/ML-% OP SOLN: 1.0000 [drp] | Freq: Four times a day (QID) | OPHTHALMIC | 0 refills | Status: AC

## 2019-10-10 NOTE — TOC Progression Note (Signed)
Transition of Care [facility]) - Progression Note    Patient Details  Name: Renee Alvarado MRN: [id number] Date of Birth: 01-28-1927  Transition of [facility]) CM/SW Contact  Audry Pecina, Edson Snowball, RN Phone Number: 10/10/2019, 10:42 AM  Clinical Narrative:     Spoke to patient again, patient still declining home health PT.   NCM asked if NCM could call her daughter Lelon Frohlich, patient  hesitated , but said yes.   Called Ann [phone]. Patient lives with another daughter. Ann wants patient to go to her place Lelon Frohlich) at discharge. However, patient wants to go to her home with her other daughter at first to "try it".   Discussed PT recommendations for HHPT and 24 hour supervision. Ann voiced understanding. Per Lelon Frohlich patient has had HHPT in past and didn't really work with them. Lelon Frohlich does not feel patient would benefit from HHPT due to same. Lelon Frohlich is on her way to the hospital and would like to speak to a PT who has worked with her mother. Secure chatted Wendi and [place] and Best boy. NCM asked Lelon Frohlich to let nurse know when she arrives.   Ann asking about a purwick from home, and has already researched them. Purewick is something family would have to arrange. Ann voiced understanding.   Patient's PCP is Dr Cecille Amsterdam at [facility].   Expected Discharge Plan: Home/Self Care Barriers to Discharge: No Barriers Identified  Expected Discharge Plan and Services Expected Discharge Plan: Home/Self Care   Discharge Planning Services: CM Consult Post Acute Care Choice: [place] arrangements for the past 2 months: Single Family Home Expected Discharge Date: 10/10/19               DME Arranged: N/A DME Agency: NA       HH Arranged: Patient Refused HH           Social Determinants of Health (SDOH) Interventions    Readmission Risk Interventions No flowsheet data found.

## 2019-10-10 NOTE — Discharge Summary (Signed)
Physician Discharge Summary  Emika Tiano QIW:[id number] DOB: [birth year] DOA: 10/06/2019  PCP: Enid Skeens., MD  Admit date: 10/06/2019 Discharge date: 10/10/2019  Admitted From: Home Disposition: Home  Recommendations for Outpatient Follow-up:  1. Follow up with PCP in 1 week 2. Please obtain BMP/CBC in one week 3. Please follow up on the following pending results: None  Home Health: Home Health PT Equipment/Devices: None  Discharge Condition: Stable CODE STATUS: DNR Diet recommendation: Heart healthy   Brief/Interim Summary:  Admission HPI written by Inda Merlin, MD   HPI:    86 year old female with past medical history of melanoma (removed from the forehead 07/2019), previous DVT (on lifelong eliquis), lupus erythematosus, polymyalgia rheumatica, hypertension, osteoporosis who presents to [facility] emergency department after experiencing a an unwitnessed fall.  Patient is a somewhat poor historian and additional history has been obtained from the daughter-in-law who is sitting at the bedside.  Patient has been experiencing frequent falls for approximately the past 2 to 3 weeks.  Most of these falls are witnessed by family members and seem to be mechanical in nature with the patient tripping on various things around the house.  Patient has suffered injuries to her head on multiple occasions including suffering a left scalp hematoma approximately 2 weeks ago.  In the past week, the patient has begun to develop an increase in frequency and severity of her chronic cough.  This is not productive.  Patient denies any associated fevers, weakness or shortness of breath.  Patient denies recent travel, sick contacts or contacts with confirmed COVID-19 infection.  Upon further questioning, patient denies abdominal pain, chest pain, nausea, vomiting, dysuria or diarrhea.  Patient denies being started on any new medications in the past several weeks.  Patient's  frequent falls continue to occur.  Earlier in the day on 8/29, patient suffered yet another fall.  While this was an unwitnessed fall, patient was unable to recall the fall at all, stating that she simply woke up on the floor.  When family came to her side, they deny seeing any seizure-like activity.    It was at this point that the patient was brought in to [facility] emergency department for evaluation.  Upon evaluation in the emergency department, patient was found to be febrile on arrival with temperature of 102 F.  Chest x-ray has revealed patchy bilateral chest infiltrates concerning for atypical pneumonia.  Patient was initiated on a regimen of intravenous ceftriaxone and azithromycin for suspected developing bilateral pneumonia.  The hospitalist group was then called to assess the patient for admission to the hospital.    Hospital course:  Community acquired pneumonia Bilateral lungs. Started empirically on Ceftriaxone and azithromycin with improvement. Patient received three days of Ceftriaxone and Azithromycin. No cough or dyspnea prior to discharge. Discharge with Cefdinir 300 mg daily (adjusted for CrCl) to complete 7 days of total antibiotics. Completed dosing for Azithromycin prior to discharge.  Frequent falls PT recommending home health PT. Transthoracic Echocardiogram unremarkable for etiology.  History of DVT Continue Eliquis  SLE Polymyalgia rheumatica Continue methotrexate and prednisone  Acute bacterial conjunctivitis Continue Polytrim; appears to be improved. Continue for 3 days on discharge.  Essential hypertension Amlodipine 10 mg daily. Propranolol discontinued secondary to significantly prolonged PR and bradycardia.  Bradycardia 1st degree AV block In setting of propranolol use. This was discontinued on discharge.  CKD stage IIIa Stable.  Diastolic heart dysfunction No heart failure symptoms.  Normocytic anemia Stable.  History of  melanoma Patient followed as an outpatient. Recent excisional biopsy.  GERD Continue Protonix  Discharge Diagnoses:  Principal Problem:   Pneumonia of both lungs due to infectious organism Active Problems:   Frequent falls   History of DVT (deep vein thrombosis)   Melanoma of forehead (HCC)   Systemic lupus erythematosus (HCC)   Polymyalgia rheumatica (HCC)   Acute bacterial conjunctivitis of both eyes   Essential hypertension   GERD without esophagitis   CAP (community acquired pneumonia)    Discharge Instructions  Discharge Instructions    Call MD for:  difficulty breathing, headache or visual disturbances   Complete by: As directed    Call MD for:  temperature >100.4   Complete by: As directed    Diet - low sodium heart healthy   Complete by: As directed    Increase activity slowly   Complete by: As directed      Allergies as of 10/10/2019   No Known Allergies     Medication List    STOP taking these medications   propranolol 10 MG tablet Commonly known as: INDERAL     TAKE these medications   acetaminophen 325 MG tablet Commonly known as: TYLENOL Take 650 mg by mouth every 6 (six) hours as needed for headache (pain).   amLODipine 10 MG tablet Commonly known as: NORVASC Take 1 tablet (10 mg total) by mouth daily. Start taking on: October 11, 2019   aspirin EC 81 MG tablet Take 81 mg by mouth daily. Swallow whole.   CALCIUM 600-D PO Take 1 tablet by mouth 2 (two) times daily.   cefdinir 300 MG capsule Commonly known as: OMNICEF Take 1 capsule (300 mg total) by mouth daily for 4 days. Start taking on: October 11, 2019   Eliquis 5 MG Tabs tablet Generic drug: apixaban Take 5 mg by mouth 2 (two) times daily.   esomeprazole 20 MG capsule Commonly known as: NEXIUM Take 20 mg by mouth daily at 12 noon.   folic acid 1 MG tablet Commonly known as: FOLVITE Take 1 mg by mouth 2 (two) times daily.   Magnesium 250 MG Tabs Take 250 mg by mouth  daily.   methotrexate 2.5 MG tablet Commonly known as: RHEUMATREX Take 10 mg by mouth every Sunday. Caution:Chemotherapy. Protect from light.   multivitamin with minerals Tabs tablet Take 1 tablet by mouth daily.   oxybutynin 5 MG tablet Commonly known as: DITROPAN Take 5 mg by mouth 2 (two) times daily.   predniSONE 5 MG tablet Commonly known as: DELTASONE Take 10 mg by mouth daily with breakfast.   sertraline 50 MG tablet Commonly known as: ZOLOFT Take 50 mg by mouth daily.   trimethoprim-polymyxin b ophthalmic solution Commonly known as: POLYTRIM Place 1 drop into both eyes every 6 (six) hours for 3 days.       Follow-up Information    Slatosky, Marshall Cork., MD. Schedule an appointment as soon as possible for a visit.   Specialty: Family Medicine Contact information: [address] [address] [phone]              No Known Allergies  Consultations:  None   Procedures/Studies: CT Head Wo Contrast  Result Date: 10/06/2019 CLINICAL DATA:  Status post trauma. EXAM: CT HEAD WITHOUT CONTRAST TECHNIQUE: Contiguous axial images were obtained from the base of the skull through the vertex without intravenous contrast. COMPARISON:  October 04, 2018 FINDINGS: Brain: There is mild cerebral atrophy with widening of the extra-axial  spaces and ventricular dilatation. There are areas of decreased attenuation within the white matter tracts of the supratentorial brain, consistent with microvascular disease changes. Vascular: No hyperdense vessel or unexpected calcification. Skull: Normal. Negative for fracture or focal lesion. Sinuses/Orbits: No acute finding. Other: Mild left frontal parietal scalp soft tissue swelling is seen. IMPRESSION: Mild left frontal parietal scalp soft tissue swelling, without evidence of acute fracture or acute intracranial abnormality. Electronically Signed   By: Virgina Norfolk M.D.   On: 10/06/2019 20:15   CT CHEST W CONTRAST  Result  Date: 10/07/2019 CLINICAL DATA:  [place], fever, abnormal chest x-ray EXAM: CT CHEST WITH CONTRAST TECHNIQUE: Multidetector CT imaging of the chest was performed during intravenous contrast administration. CONTRAST:  64mL OMNIPAQUE IOHEXOL 300 MG/ML  SOLN COMPARISON:  11/18/2015, 10/06/2019 FINDINGS: Cardiovascular: The heart is mildly enlarged without pericardial effusion. Normal caliber of the thoracic aorta, with no evidence of dissection. There is extensive atherosclerosis throughout the thoracic aorta and coronary vasculature. Mediastinum/Nodes: No enlarged mediastinal, hilar, or axillary lymph nodes. Thyroid gland, trachea, and esophagus demonstrate no significant findings. Lungs/Pleura: Multifocal areas of consolidation are identified, most pronounced within the mid to upper lung zones. The consolidation within the upper lobes has a somewhat linear configuration, and may reflect atelectasis or scarring. Consolidation within the superior segment left lower lobe is most consistent with pneumonia. There are trace bilateral pleural effusions, left greater than right. No pneumothorax. The central airways are patent. Upper Abdomen: No acute abnormality. Musculoskeletal: There are no acute displaced fractures. Reconstructed images demonstrate no additional findings. IMPRESSION: 1. Left lower lobe consolidation most compatible with pneumonia. 2. Bilateral upper lobe linear consolidation most compatible with scarring or atelectasis. 3. Trace bilateral pleural effusions, left greater than right. 4.  Aortic Atherosclerosis (ICD10-I70.0). Electronically Signed   By: Randa Ngo M.D.   On: 10/07/2019 03:02   CT Cervical Spine Wo Contrast  Result Date: 10/06/2019 CLINICAL DATA:  Facial trauma, posterior scalp hematoma, fell, anticoagulated EXAM: CT CERVICAL SPINE WITHOUT CONTRAST TECHNIQUE: Multidetector CT imaging of the cervical spine was performed without intravenous contrast. Multiplanar CT image reconstructions  were also generated. COMPARISON:  08/18/2017 FINDINGS: Alignment: There is right convex scoliosis of the cervical spine. Mild anterolisthesis of C4 on C5 is noted, likely due to extensive spondylosis and facet hypertrophy. Skull base and vertebrae: There are no acute displaced cervical spine fractures. Soft tissues and spinal canal: No prevertebral fluid or swelling. No visible canal hematoma. Disc levels: Severe hypertrophic changes are seen at the C1/C2 interface, right greater than left. There is bony fusion across the facet joints from C2 through C4. Severe facet hypertrophy is noted at C4/C5 and C5/C6. The left C6/C7 facets are fused. There is extensive multilevel spondylosis, most pronounced at the C4/C5 level. There is bilateral neural foraminal encroachment at C4/C5 lateralizing to the right, and symmetrically at C5/C6. Left predominant neural foraminal narrowing is seen at C6/C7. Upper chest: Airway is patent. Biapical pleural and parenchymal scarring, as well as calcified pleural plaques, are noted. Other: Reconstructed images demonstrate no additional findings. IMPRESSION: 1. No evidence of cervical spine fracture. 2. Extensive multilevel cervical degenerative changes as above. Electronically Signed   By: Randa Ngo M.D.   On: 10/06/2019 20:16   MR BRAIN W WO CONTRAST  Result Date: 10/07/2019 CLINICAL DATA:  Metastatic disease evaluation; history of melanoma to the forehead. Fall.  Asymmetric pupils. EXAM: MRI HEAD WITHOUT AND WITH CONTRAST TECHNIQUE: Multiplanar, multiecho pulse sequences of the brain and surrounding structures were  obtained without and with intravenous contrast. CONTRAST:  5.39mL GADAVIST GADOBUTROL 1 MMOL/ML IV SOLN COMPARISON:  Head CT from yesterday FINDINGS: Brain: No acute infarction, hemorrhage, hydrocephalus, extra-axial collection or mass lesion. A small nodular appearing focus along the left frontal convexity on coronal postcontrast imaging has a vein like appearance on  axial thin slices. Chronic small vessel ischemia with confluent periventricular and pontine gliosis. Small remote cerebellar infarcts. Cerebral volume loss which is generalized Vascular: Normal flow voids and vascular enhancements Skull and upper cervical spine: Thinning and increased enhancement along the skin surface of the central forehead. No underlying marrow signal abnormality. C2-3 and C3-4 fusion with C4-5 advanced disc and facet degeneration leading to anterolisthesis. Small scalp hematoma laterally on the left. Sinuses/Orbits: No evidence of injury. Bilateral cataract resection. IMPRESSION: 1. No acute intracranial finding.  No evidence of brain metastasis. 2. Postoperative forehead without underlying bony abnormality. Left temporal scalp hematoma. 3. Chronic small vessel ischemia. Electronically Signed   By: Monte Fantasia M.D.   On: 10/07/2019 04:26   DG Pelvis Portable  Result Date: 10/06/2019 CLINICAL DATA:  87 year old post fall. EXAM: PORTABLE PELVIS 1-2 VIEWS COMPARISON:  None. FINDINGS: The cortical margins of the bony pelvis are intact. No fracture. Pubic symphysis and sacroiliac joints are congruent. Both femoral heads are well-seated in the respective acetabula. Degenerative change of the sacroiliac joints, hips, and pubic symphysis. Advanced vascular calcifications. IMPRESSION: No pelvic fracture. Electronically Signed   By: Keith Rake M.D.   On: 10/06/2019 19:26   DG Chest Port 1 View  Result Date: 10/06/2019 CLINICAL DATA:  86 year old post fall at home. EXAM: PORTABLE CHEST 1 VIEW COMPARISON:  Radiograph 10/04/2018 FINDINGS: Stable heart size and mediastinal contours. Aortic atherosclerosis. No pneumothorax. There is biapical pleuroparenchymal scarring. Ill-defined opacities in the periphery of the left greater than right mid lung. No significant pleural effusion. Chronic change of the left shoulder. No acute osseous abnormalities are seen. IMPRESSION: Ill-defined opacities in  the periphery of the left greater than right mid lung, indeterminate for pulmonary contusion versus infection in the setting of fall. Aortic Atherosclerosis (ICD10-I70.0). Electronically Signed   By: Keith Rake M.D.   On: 10/06/2019 19:25   DG Shoulder Left  Result Date: 10/06/2019 CLINICAL DATA:  Left shoulder pain. EXAM: LEFT SHOULDER - 2+ VIEW COMPARISON:  June 06, 2019 (1:35 p.m.) FINDINGS: There is no evidence of an acute fracture or dislocation. Mild degenerative changes seen involving the left acromioclavicular joint and left glenohumeral articulation. Moderate severity chronic changes are noted along the inferior aspect of the left glenoid. An 8 mm well-defined area of soft tissue calcification is seen just below the left glenoid. This is seen on the prior study. Mild soft tissue swelling is seen above the left acromioclavicular joint. IMPRESSION: 1. Degenerative changes, as described above, without evidence of acute fracture or dislocation. 2. Soft tissue swelling above the left acromioclavicular joint. Electronically Signed   By: Virgina Norfolk M.D.   On: 10/06/2019 19:55   ECHOCARDIOGRAM COMPLETE  Result Date: 10/07/2019    ECHOCARDIOGRAM REPORT   Patient Name:   GERLINE RATTO Date of Exam: 10/07/2019 Medical Rec #:  [id number]   Height:       64.0 in Accession #:    [id number]  Weight:       126.0 lb Date of Birth:  [birth year]   BSA:          1.608 m Patient Age:    86 years    BP:  161/54 mmHg Patient Gender: F           HR:           74 bpm. Exam Location:  Inpatient Procedure: 2D Echo, Cardiac Doppler and Color Doppler Indications:    Syncope 780.2 / R55  History:        Patient has no prior history of Echocardiogram examinations.                 Risk Factors:Hypertension and GERD.  Sonographer:    Bernadene Person RDCS Referring Phys: [id number] Barry  1. Left ventricular ejection fraction, by estimation, is 65 to 70%. The left ventricle has normal function.  The left ventricle has no regional wall motion abnormalities. There is moderate left ventricular hypertrophy. Left ventricular diastolic parameters are consistent with Grade I diastolic dysfunction (impaired relaxation). Elevated left ventricular end-diastolic pressure.  2. Right ventricular systolic function is normal. The right ventricular size is normal. There is normal pulmonary artery systolic pressure. The estimated right ventricular systolic pressure is 81.8 mmHg.  3. The mitral valve is abnormal. Trivial mitral valve regurgitation.  4. The aortic valve is tricuspid. Aortic valve regurgitation is not visualized. Mild aortic valve sclerosis is present, with no evidence of aortic valve stenosis.  5. The inferior vena cava is normal in size with greater than 50% respiratory variability, suggesting right atrial pressure of 3 mmHg. FINDINGS  Left Ventricle: Left ventricular ejection fraction, by estimation, is 65 to 70%. The left ventricle has normal function. The left ventricle has no regional wall motion abnormalities. The left ventricular internal cavity size was normal in size. There is  moderate left ventricular hypertrophy. Left ventricular diastolic parameters are consistent with Grade I diastolic dysfunction (impaired relaxation). Elevated left ventricular end-diastolic pressure. Right Ventricle: The right ventricular size is normal. No increase in right ventricular wall thickness. Right ventricular systolic function is normal. There is normal pulmonary artery systolic pressure. The tricuspid regurgitant velocity is 2.76 m/s, and  with an assumed right atrial pressure of 3 mmHg, the estimated right ventricular systolic pressure is 29.9 mmHg. Left Atrium: Left atrial size was normal in size. Right Atrium: Right atrial size was normal in size. Pericardium: There is no evidence of pericardial effusion. Mitral Valve: The mitral valve is abnormal. There is moderate calcification of the anterior mitral valve  leaflet(s). Trivial mitral valve regurgitation. Tricuspid Valve: The tricuspid valve is grossly normal. Tricuspid valve regurgitation is mild. Aortic Valve: The aortic valve is tricuspid. Aortic valve regurgitation is not visualized. Mild aortic valve sclerosis is present, with no evidence of aortic valve stenosis. Pulmonic Valve: The pulmonic valve was normal in structure. Pulmonic valve regurgitation is not visualized. Aorta: The aortic root and ascending aorta are structurally normal, with no evidence of dilitation. Venous: The inferior vena cava is normal in size with greater than 50% respiratory variability, suggesting right atrial pressure of 3 mmHg. IAS/Shunts: No atrial level shunt detected by color flow Doppler.  LEFT VENTRICLE PLAX 2D LVIDd:         4.20 cm  Diastology LVIDs:         2.65 cm  LV e' lateral:   7.01 cm/s LV PW:         1.30 cm  LV E/e' lateral: 16.8 LV IVS:        1.30 cm  LV e' medial:    5.22 cm/s LVOT diam:     1.80 cm  LV E/e' medial:  22.6 LV SV:  68 LV SV Index:   42 LVOT Area:     2.54 cm  RIGHT VENTRICLE RV S prime:     12.00 cm/s TAPSE (M-mode): 2.2 cm LEFT ATRIUM             Index       RIGHT ATRIUM           Index LA diam:        4.20 cm 2.61 cm/m  RA Area:     17.40 cm LA Vol (A2C):   50.9 ml 31.66 ml/m RA Volume:   44.10 ml  27.43 ml/m LA Vol (A4C):   52.5 ml 32.66 ml/m LA Biplane Vol: 51.8 ml 32.22 ml/m  AORTIC VALVE LVOT Vmax:   114.00 cm/s LVOT Vmean:  79.400 cm/s LVOT VTI:    0.267 m  AORTA Ao Root diam: 2.70 cm Ao Asc diam:  2.90 cm MITRAL VALVE                TRICUSPID VALVE MV Area (PHT): 4.89 cm     TR Peak grad:   30.5 mmHg MV Decel Time: 155 msec     TR Vmax:        276.00 cm/s MV E velocity: 118.00 cm/s MV A velocity: 127.00 cm/s  SHUNTS MV E/A ratio:  0.93         Systemic VTI:  0.27 m                             Systemic Diam: 1.80 cm Lyman Bishop MD Electronically signed by Lyman Bishop MD Signature Date/Time: 10/07/2019/10:30:41 AM    Final      TRANSTHORACIC ECHOCARDIOGRAM (10/07/2019) IMPRESSIONS    1. Left ventricular ejection fraction, by estimation, is 65 to 70%. The  left ventricle has normal function. The left ventricle has no regional  wall motion abnormalities. There is moderate left ventricular hypertrophy.  Left ventricular diastolic  parameters are consistent with Grade I diastolic dysfunction (impaired  relaxation). Elevated left ventricular end-diastolic pressure.  2. Right ventricular systolic function is normal. The right ventricular  size is normal. There is normal pulmonary artery systolic pressure. The  estimated right ventricular systolic pressure is 81.2 mmHg.  3. The mitral valve is abnormal. Trivial mitral valve regurgitation.  4. The aortic valve is tricuspid. Aortic valve regurgitation is not  visualized. Mild aortic valve sclerosis is present, with no evidence of  aortic valve stenosis.  5. The inferior vena cava is normal in size with greater than 50%  respiratory variability, suggesting right atrial pressure of 3 mmHg.    Subjective: No dyspnea today.  Discharge Exam: Vitals:   10/10/19 0046 10/10/19 0451  BP: (!) 148/66 (!) 159/65  Pulse: 61 61  Resp: 16 14  Temp: 98.7 F (37.1 C) 99.1 F (37.3 C)  SpO2: 95% 94%   Vitals:   10/09/19 1651 10/09/19 1801 10/10/19 0046 10/10/19 0451  BP: (!) 144/53 (!) 145/55 (!) 148/66 (!) 159/65  Pulse: 67 62 61 61  Resp:  16 16 14   Temp:  99.7 F (37.6 C) 98.7 F (37.1 C) 99.1 F (37.3 C)  TempSrc:  Oral Oral Oral  SpO2:  99% 95% 94%  Weight:      Height:        General: Pt is alert, awake, not in acute distress Cardiovascular: RRR, S1/S2 +, no rubs, no gallops Respiratory: CTA bilaterally, no wheezing, no rhonchi Abdominal: Soft, NT,  ND, bowel sounds + Extremities: no edema, no cyanosis    The results of significant diagnostics from this hospitalization (including imaging, microbiology, ancillary and laboratory) are listed below  for reference.     Microbiology: Recent Results (from the past 240 hour(s))  SARS Coronavirus 2 by RT PCR (hospital order, performed in [facility] hospital lab) Nasopharyngeal Nasopharyngeal Swab     Status: None   Collection Time: 10/06/19  7:02 PM   Specimen: Nasopharyngeal Swab  Result Value Ref Range Status   SARS Coronavirus 2 NEGATIVE NEGATIVE Final    Comment: (NOTE) SARS-CoV-2 target nucleic acids are NOT DETECTED.  The SARS-CoV-2 RNA is generally detectable in upper and lower respiratory specimens during the acute phase of infection. The lowest concentration of SARS-CoV-2 viral copies this assay can detect is 250 copies / mL. A negative result does not preclude SARS-CoV-2 infection and should not be used as the sole basis for treatment or other patient management decisions.  A negative result may occur with improper specimen collection / handling, submission of specimen other than nasopharyngeal swab, presence of viral mutation(s) within the areas targeted by this assay, and inadequate number of viral copies (<250 copies / mL). A negative result must be combined with clinical observations, patient history, and epidemiological information.  Fact Sheet for Patients:   StrictlyIdeas.no  Fact Sheet for Healthcare Providers: BankingDealers.co.za  This test is not yet approved or  cleared by the [place] FDA and has been authorized for detection and/or diagnosis of SARS-CoV-2 by FDA under an Emergency Use Authorization (EUA).  This EUA will remain in effect (meaning this test can be used) for the duration of the COVID-19 declaration under Section 564(b)(1) of the Act, 21 U.S.C. section 360bbb-3(b)(1), unless the authorization is terminated or revoked sooner.  Performed at [facility], [place] [address] [place], [place] [address]   Urine culture     Status: Abnormal   Collection Time: 10/06/19  7:05 PM   Specimen:  In/Out Cath Urine  Result Value Ref Range Status   Specimen Description IN/OUT CATH URINE  Final   Special Requests   Final    NONE Performed at [facility], [place] [address], [place], [place] [address]    Culture MULTIPLE SPECIES PRESENT, SUGGEST RECOLLECTION (A)  Final   Report Status 10/07/2019 FINAL  Final  Blood culture (routine x 2)     Status: None (Preliminary result)   Collection Time: 10/06/19  9:28 PM   Specimen: BLOOD  Result Value Ref Range Status   Specimen Description BLOOD LEFT ANTECUBITAL  Final   Special Requests   Final    BOTTLES DRAWN AEROBIC AND ANAEROBIC Blood Culture adequate volume   Culture   Final    NO GROWTH 4 DAYS Performed at [facility], [place] [address] [place], [place] [address]    Report Status PENDING  Incomplete  Blood culture (routine x 2)     Status: None (Preliminary result)   Collection Time: 10/07/19 12:51 AM   Specimen: BLOOD RIGHT HAND  Result Value Ref Range Status   Specimen Description BLOOD RIGHT HAND  Final   Special Requests   Final    BOTTLES DRAWN AEROBIC AND ANAEROBIC Blood Culture adequate volume   Culture   Final    NO GROWTH 3 DAYS Performed at [facility], [place] [address] [place],  [address]    Report Status PENDING  Incomplete     Labs: BNP (last  3 results) Recent Labs    10/06/19 1858  BNP 810.1*   Basic Metabolic Panel: Recent Labs  Lab 10/06/19 1858 10/06/19 1858 10/06/19 1904 10/07/19 0528 10/08/19 0347 10/09/19 0630 10/10/19 0249  NA 139   < > 139 140 141 142 143  K 3.7   < > 3.7 3.8 3.9 3.9 4.0  CL 106   < > 105 108 111 109 110  CO2 24  --   --  23 21* 23 25  GLUCOSE 117*   < > 109* 111* 105* 87 88  BUN 24*   < > 23 17 17 13 14   CREATININE 1.14*   < > 1.10* 1.05* 1.10* 1.10* 1.12*  CALCIUM 8.8*  --   --  8.3* 8.8* 8.7* 8.8*  MG  --   --   --   --   --   --  2.1   < > = values in this interval not displayed.   Liver Function Tests: Recent Labs  Lab 10/06/19 1858  10/07/19 0528  AST 18 16  ALT 16 13  ALKPHOS 52 50  BILITOT 0.8 0.9  PROT 5.7* 4.7*  ALBUMIN 2.9* 2.5*   No results for input(s): LIPASE, AMYLASE in the last 168 hours. No results for input(s): AMMONIA in the last 168 hours. CBC: Recent Labs  Lab 10/06/19 1858 10/06/19 1904 10/08/19 0755 10/09/19 0630 10/10/19 0249  WBC 10.3  --  7.9 7.3 6.7  HGB 10.6* 9.5* 10.3* 9.9* 10.3*  HCT 33.6* 28.0* 33.0* 32.1* 33.3*  MCV 92.6  --  90.7 90.4 91.7  PLT 220  --  258 245 248   Cardiac Enzymes: No results for input(s): CKTOTAL, CKMB, CKMBINDEX, TROPONINI in the last 168 hours. BNP: Invalid input(s): POCBNP CBG: No results for input(s): GLUCAP in the last 168 hours. D-Dimer No results for input(s): DDIMER in the last 72 hours. Hgb A1c No results for input(s): HGBA1C in the last 72 hours. Lipid Profile No results for input(s): CHOL, HDL, LDLCALC, TRIG, CHOLHDL, LDLDIRECT in the last 72 hours. Thyroid function studies Recent Labs    10/10/19 0249  TSH 2.910   Anemia work up No results for input(s): VITAMINB12, FOLATE, FERRITIN, TIBC, IRON, RETICCTPCT in the last 72 hours. Urinalysis    Component Value Date/Time   COLORURINE YELLOW 10/06/2019 1856   APPEARANCEUR CLEAR 10/06/2019 1856   LABSPEC 1.008 10/06/2019 1856   PHURINE 7.0 10/06/2019 1856   GLUCOSEU NEGATIVE 10/06/2019 1856   HGBUR NEGATIVE 10/06/2019 1856   BILIRUBINUR NEGATIVE 10/06/2019 1856   KETONESUR NEGATIVE 10/06/2019 1856   PROTEINUR NEGATIVE 10/06/2019 1856   NITRITE NEGATIVE 10/06/2019 1856   LEUKOCYTESUR TRACE (A) 10/06/2019 1856   Sepsis Labs Invalid input(s): PROCALCITONIN,  WBC,  LACTICIDVEN Microbiology Recent Results (from the past 240 hour(s))  SARS Coronavirus 2 by RT PCR (hospital order, performed in [place] hospital lab) Nasopharyngeal Nasopharyngeal Swab     Status: None   Collection Time: 10/06/19  7:02 PM   Specimen: Nasopharyngeal Swab  Result Value Ref Range Status   SARS  Coronavirus 2 NEGATIVE NEGATIVE Final    Comment: (NOTE) SARS-CoV-2 target nucleic acids are NOT DETECTED.  The SARS-CoV-2 RNA is generally detectable in upper and lower respiratory specimens during the acute phase of infection. The lowest concentration of SARS-CoV-2 viral copies this assay can detect is 250 copies / mL. A negative result does not preclude SARS-CoV-2 infection and should not be used as the sole basis for treatment or other  patient management decisions.  A negative result may occur with improper specimen collection / handling, submission of specimen other than nasopharyngeal swab, presence of viral mutation(s) within the areas targeted by this assay, and inadequate number of viral copies (<250 copies / mL). A negative result must be combined with clinical observations, patient history, and epidemiological information.  Fact Sheet for Patients:   StrictlyIdeas.no  Fact Sheet for Healthcare Providers: BankingDealers.co.za  This test is not yet approved or  cleared by the [place] FDA and has been authorized for detection and/or diagnosis of SARS-CoV-2 by FDA under an Emergency Use Authorization (EUA).  This EUA will remain in effect (meaning this test can be used) for the duration of the COVID-19 declaration under Section 564(b)(1) of the Act, 21 U.S.C. section 360bbb-3(b)(1), unless the authorization is terminated or revoked sooner.  Performed at [facility], [place] [address], [place], [place] [address]   Urine culture     Status: Abnormal   Collection Time: 10/06/19  7:05 PM   Specimen: In/Out Cath Urine  Result Value Ref Range Status   Specimen Description IN/OUT CATH URINE  Final   Special Requests   Final    NONE Performed at [facility], [place] [address], [place], [place] [address]    Culture MULTIPLE SPECIES PRESENT, SUGGEST RECOLLECTION (A)  Final   Report Status 10/07/2019 FINAL  Final    Blood culture (routine x 2)     Status: None (Preliminary result)   Collection Time: 10/06/19  9:28 PM   Specimen: BLOOD  Result Value Ref Range Status   Specimen Description BLOOD LEFT ANTECUBITAL  Final   Special Requests   Final    BOTTLES DRAWN AEROBIC AND ANAEROBIC Blood Culture adequate volume   Culture   Final    NO GROWTH 4 DAYS Performed at [facility], [place] [address], [place], [place] [address]    Report Status PENDING  Incomplete  Blood culture (routine x 2)     Status: None (Preliminary result)   Collection Time: 10/07/19 12:51 AM   Specimen: BLOOD RIGHT HAND  Result Value Ref Range Status   Specimen Description BLOOD RIGHT HAND  Final   Special Requests   Final    BOTTLES DRAWN AEROBIC AND ANAEROBIC Blood Culture adequate volume   Culture   Final    NO GROWTH 3 DAYS Performed at [facility], [place] [address] [place],  [address]    Report Status PENDING  Incomplete     Time coordinating discharge: 35 minutes  SIGNED:   Cordelia Poche, MD [facility] 10/10/2019, 11:50 AM

## 2019-10-10 NOTE — Progress Notes (Signed)
Physical Therapy Treatment Patient Details Name: Renee Alvarado MRN: [id number] DOB: Jul 24, 1926 Today's Date: 10/10/2019    History of Present Illness Pt is a 86 y/o female admitted secondary to CAP and frequent falls. PMH includes DVT, HTN, polymayalgia rheumatica and SLE.     PT Comments    Pt progressing towards goals. Seen per daughter's request as she was wanting to see how much assist was needed to help pt. Pt requiring min A for short distance ambulation this session. Educated pt's daughter about how much assist for gait and educated to use [facility] for longer distances. Also gave gait belt. Will continue to follow acutely to maximize functional mobility independence and safety.     Follow Up Recommendations  Home health PT;Supervision/Assistance - 24 hour     Equipment Recommendations  None recommended by PT    Recommendations for Other Services       Precautions / Restrictions Precautions Precautions: Fall Restrictions Weight Bearing Restrictions: No    Mobility  Bed Mobility Overal bed mobility: Needs Assistance Bed Mobility: Supine to Sit;Sit to Supine     Supine to sit: Min assist Sit to supine: Supervision   General bed mobility comments: Min A for trunk assist to come to sitting. Supervision to return to supine.   Transfers Overall transfer level: Needs assistance Equipment used: Rolling walker (2 wheeled) Transfers: Sit to/from Stand Sit to Stand: Min assist         General transfer comment: Min A for lift assist. Increased time to come to standing.   Ambulation/Gait Ambulation/Gait assistance: Min assist Gait Distance (Feet): 7 Feet Assistive device: Rolling walker (2 wheeled) Gait Pattern/deviations: Step-through pattern;Decreased stride length;Trunk flexed Gait velocity: Decreased   General Gait Details: Improved steadiness, however, continues to require min A for steadying. Daughter in room and educated about needed assist when at home and educated  about using [facility] for longer distances. Gave gait belt as well.    Stairs             Wheelchair Mobility    Modified Rankin (Stroke Patients Only)       Balance Overall balance assessment: Needs assistance Sitting-balance support: No upper extremity supported Sitting balance-Leahy Scale: Fair     Standing balance support: Bilateral upper extremity supported;During functional activity Standing balance-Leahy Scale: Poor Standing balance comment: Reliant on BUE and external support                             Cognition Arousal/Alertness: Awake/alert Behavior During Therapy: WFL for tasks assessed/performed Overall Cognitive Status: Within Functional Limits for tasks assessed                                        Exercises      General Comments General comments (skin integrity, edema, etc.): Pt's daughter present throughout session       Pertinent Vitals/Pain Pain Assessment: No/denies pain    Home Living                      Prior Function            PT Goals (current goals can now be found in the care plan section) Acute Rehab PT Goals Patient Stated Goal: to go home PT Goal Formulation: With patient Time For Goal Achievement: 10/21/19 Potential to Achieve Goals: Good Progress  towards PT goals: Progressing toward goals    Frequency    Min 3X/week      PT Plan Current plan remains appropriate    Co-evaluation              AM-PAC PT "6 Clicks" Mobility   Outcome Measure  Help needed turning from your back to your side while in a flat bed without using bedrails?: A Little Help needed moving from lying on your back to sitting on the side of a flat bed without using bedrails?: A Little Help needed moving to and from a bed to a chair (including a wheelchair)?: A Little Help needed standing up from a chair using your arms (e.g., wheelchair or bedside chair)?: A Little Help needed to walk in hospital room?:  A Little Help needed climbing 3-5 steps with a railing? : A Lot 6 Click Score: 17    End of Session Equipment Utilized During Treatment: Gait belt Activity Tolerance: Patient tolerated treatment well Patient left: in bed;with call bell/phone within reach;with bed alarm set;with family/visitor present Nurse Communication: Mobility status PT Visit Diagnosis: Unsteadiness on feet (R26.81);Muscle weakness (generalized) (M62.81)     Time: 1345-1400 PT Time Calculation (min) (ACUTE ONLY): 15 min  Charges:  $Therapeutic Activity: 8-22 mins                     Lou Miner, DPT  Acute Rehabilitation Services  Pager: [phone] Office: [phone]    Rudean Hitt 10/10/2019, 3:50 PM

## 2019-10-10 NOTE — Progress Notes (Signed)
Cardiac abnormalities reported from [facility]; MD notified. ekg ordered.

## 2019-10-10 NOTE — Discharge Instructions (Addendum)
Renee Alvarado,  You were in the hospital for pneumonia. You will be discharged with antibiotics. I have also discontinued your propranolol as it was causing your heart electrical function to be delayed. Please discuss these changes with your PCP.     Community-Acquired Pneumonia, Adult Pneumonia is an infection of the lungs. It causes swelling in the airways of the lungs. Mucus and fluid may also build up inside the airways. One type of pneumonia can happen while a person is in a hospital. A different type can happen when a person is not in a hospital (community-acquired pneumonia).  What are the causes?  This condition is caused by germs (viruses, bacteria, or fungi). Some types of germs can be passed from one person to another. This can happen when you breathe in droplets from the cough or sneeze of an infected person. What increases the risk? You are more likely to develop this condition if you:  Have a long-term (chronic) disease, such as: ? Chronic obstructive pulmonary disease (COPD). ? Asthma. ? Cystic fibrosis. ? Congestive heart failure. ? Diabetes. ? Kidney disease.  Have HIV.  Have sickle cell disease.  Have had your spleen removed.  Do not take good care of your teeth and mouth (poor dental hygiene).  Have a medical condition that increases the risk of breathing in droplets from your own mouth and nose.  Have a weakened body defense system (immune system).  Are a smoker.  Travel to areas where the germs that cause this illness are common.  Are around certain animals or the places they live. What are the signs or symptoms?  A dry cough.  A wet (productive) cough.  Fever.  Sweating.  Chest pain. This often happens when breathing deeply or coughing.  Fast breathing or trouble breathing.  Shortness of breath.  Shaking chills.  Feeling tired (fatigue).  Muscle aches. How is this treated? Treatment for this condition depends on many things. Most  adults can be treated at home. In some cases, treatment must happen in a hospital. Treatment may include:  Medicines given by mouth or through an IV tube.  Being given extra oxygen.  Respiratory therapy. In rare cases, treatment for very bad pneumonia may include:  Using a machine to help you breathe.  Having a procedure to remove fluid from around your lungs. Follow these instructions at home: Medicines  Take over-the-counter and prescription medicines only as told by your doctor. ? Only take cough medicine if you are losing sleep.  If you were prescribed an antibiotic medicine, take it as told by your doctor. Do not stop taking the antibiotic even if you start to feel better. General instructions   Sleep with your head and neck raised (elevated). You can do this by sleeping in a recliner or by putting a few pillows under your head.  Rest as needed. Get at least 8 hours of sleep each night.  Drink enough water to keep your pee (urine) pale yellow.  Eat a healthy diet that includes plenty of vegetables, fruits, whole grains, low-fat dairy products, and lean protein.  Do not use any products that contain nicotine or tobacco. These include cigarettes, e-cigarettes, and chewing tobacco. If you need help quitting, ask your doctor.  Keep all follow-up visits as told by your doctor. This is important. How is this prevented? A shot (vaccine) can help prevent pneumonia. Shots are often suggested for:  People older than 84 years of age.  People older than 84 years of age  who: ? Are having cancer treatment. ? Have long-term (chronic) lung disease. ? Have problems with their body's defense system. You may also prevent pneumonia if you take these actions:  Get the flu (influenza) shot every year.  Go to the dentist as often as told.  Wash your hands often. If you cannot use soap and water, use hand sanitizer. Contact a doctor if:  You have a fever.  You lose sleep because  your cough medicine does not help. Get help right away if:  You are short of breath and it gets worse.  You have more chest pain.  Your sickness gets worse. This is very serious if: ? You are an older adult. ? Your body's defense system is weak.  You cough up blood. Summary  Pneumonia is an infection of the lungs.  Most adults can be treated at home. Some will need treatment in a hospital.  Drink enough water to keep your pee pale yellow.  Get at least 8 hours of sleep each night. This information is not intended to replace advice given to you by your health care provider. Make sure you discuss any questions you have with your health care provider. Document Revised: 05/16/2018 Document Reviewed: 09/21/2017 Elsevier Patient Education  [place].  ===========================================================================================================================================  Information on my medicine - ELIQUIS (apixaban)  Why was Eliquis prescribed for you? Eliquis was prescribed to treat blood clots that may have been previously found in the veins of your legs (deep vein thrombosis) or in your lungs (pulmonary embolism) and to reduce the risk of them occurring again.  What do You need to know about Eliquis ?  Your current dose is ONE 5 mg tablet taken TWICE daily.  Eliquis may be taken with or without food.   Try to take the dose about the same time in the morning and in the evening. If you have difficulty swallowing the tablet whole please discuss with your pharmacist how to take the medication safely.  Take Eliquis exactly as prescribed and DO NOT stop taking Eliquis without talking to the doctor who prescribed the medication.  Stopping may increase your risk of developing a new blood clot.  Refill your prescription before you run out.  After discharge, you should have regular check-up appointments with your healthcare provider that is prescribing  your Eliquis.    What do you do if you miss a dose? If a dose of ELIQUIS is not taken at the scheduled time, take it as soon as possible on the same day and twice-daily administration should be resumed. The dose should not be doubled to make up for a missed dose.  Important Safety Information A possible side effect of Eliquis is bleeding. You should call your healthcare provider right away if you experience any of the following: ? Bleeding from an injury or your nose that does not stop. ? Unusual colored urine (red or dark brown) or unusual colored stools (red or black). ? Unusual bruising for unknown reasons. ? A serious fall or if you hit your head (even if there is no bleeding).  Some medicines may interact with Eliquis and might increase your risk of bleeding or clotting while on Eliquis. To help avoid this, consult your healthcare provider or pharmacist prior to using any new prescription or non-prescription medications, including herbals, vitamins, non-steroidal anti-inflammatory drugs (NSAIDs) and supplements.  This website has more information on Eliquis (apixaban): http://www.eliquis.com/eliquis/home

## 2019-10-10 NOTE — Care Management Important Message (Signed)
Important Message  Patient Details  Name: Renee Alvarado MRN: [id number] Date of Birth: [birth year]   Medicare Important Message Given:  Yes - Important Message mailed due to current National Emergency  Verbal consent obtained due to current National Emergency  Relationship to patient: Other [employer] (must comment) Contact Name: Jaline Pincock Call Date: 10/10/19  Time: 1327 Phone: [phone] Outcome: No Answer/Busy Important Message mailed to: Patient address on file    Delorse Lek 10/10/2019, 1:27 PM

## 2019-10-10 NOTE — Progress Notes (Signed)
Pt given discharge summary and discharged via daughter as transportation.

## 2019-10-11 ENCOUNTER — Encounter (HOSPITAL_COMMUNITY): Payer: Self-pay | Admitting: Emergency Medicine

## 2019-10-11 LAB — CULTURE, BLOOD (ROUTINE X 2)
Culture: NO GROWTH
Special Requests: ADEQUATE

## 2019-10-12 LAB — CULTURE, BLOOD (ROUTINE X 2)
Culture: NO GROWTH
Special Requests: ADEQUATE

## 2020-10-14 DIAGNOSIS — I447 Left bundle-branch block, unspecified: Secondary | ICD-10-CM | POA: Diagnosis not present

## 2020-10-15 DIAGNOSIS — I447 Left bundle-branch block, unspecified: Secondary | ICD-10-CM | POA: Diagnosis not present

## 2020-10-19 DIAGNOSIS — I4891 Unspecified atrial fibrillation: Secondary | ICD-10-CM | POA: Diagnosis not present

## 2020-10-22 ENCOUNTER — Other Ambulatory Visit: Payer: Self-pay

## 2020-10-22 DIAGNOSIS — E785 Hyperlipidemia, unspecified: Secondary | ICD-10-CM | POA: Insufficient documentation

## 2020-10-22 DIAGNOSIS — I82409 Acute embolism and thrombosis of unspecified deep veins of unspecified lower extremity: Secondary | ICD-10-CM | POA: Insufficient documentation

## 2020-11-04 ENCOUNTER — Encounter: Payer: Self-pay | Admitting: *Deleted

## 2020-11-04 ENCOUNTER — Encounter: Payer: Self-pay | Admitting: Cardiology

## 2020-11-06 ENCOUNTER — Ambulatory Visit: Payer: 59 | Admitting: Cardiology

## 2021-01-19 IMAGING — CT CT HEAD W/O CM
3 series · 14 of 47 positions shown, 16 images · non-contrast
Comparison: October 04, 2018

CLINICAL DATA: Status post trauma.

EXAM:
CT HEAD WITHOUT CONTRAST
TECHNIQUE: Contiguous axial images were obtained from the base of the skull
through the vertex without intravenous contrast.

[Series 3: head 5.0 h30s · axial · 0.48mm/px · z∈[-140,-5]mm · 8 of 33 slices shown, 10 images]
[im 3/33  brain]
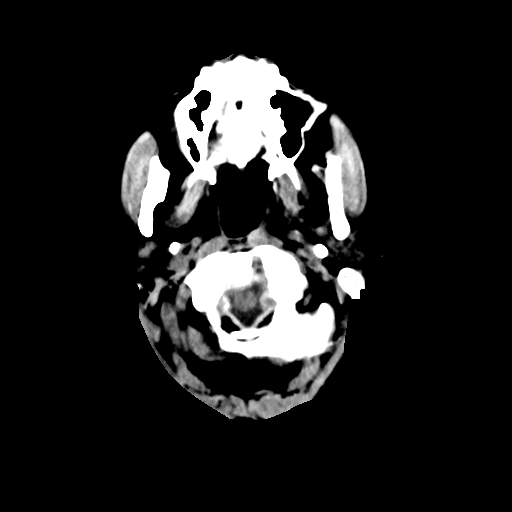
[im 3/33  bone]
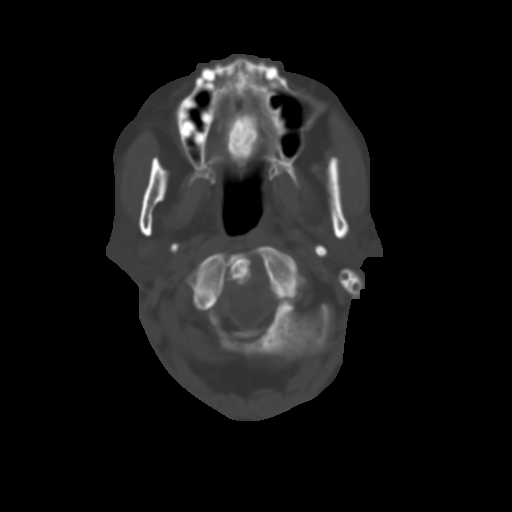
[im 7/33  brain]
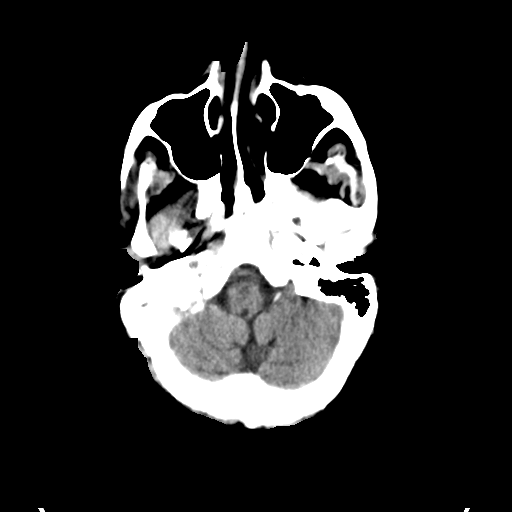
[im 10/33  brain]
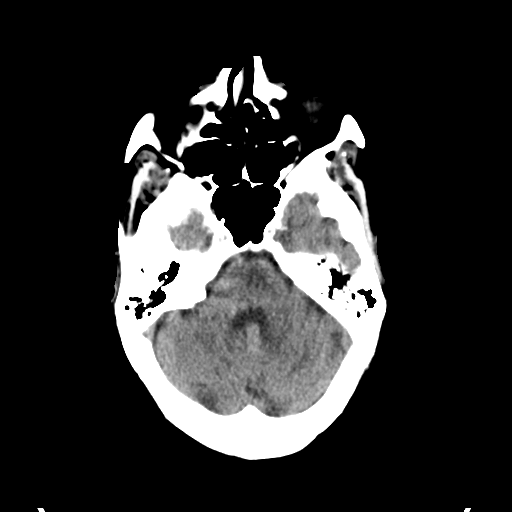
[im 15/33  brain]
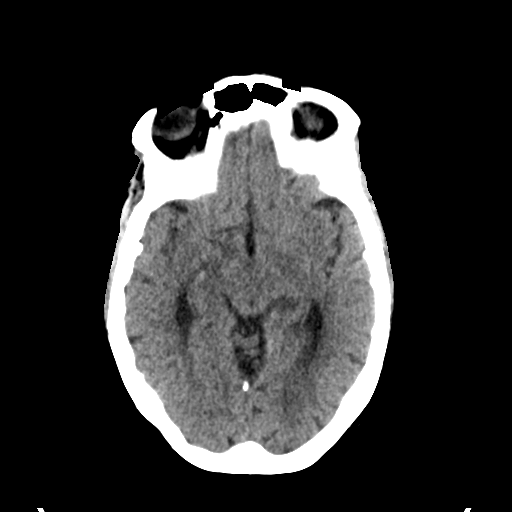
[im 18/33  brain]
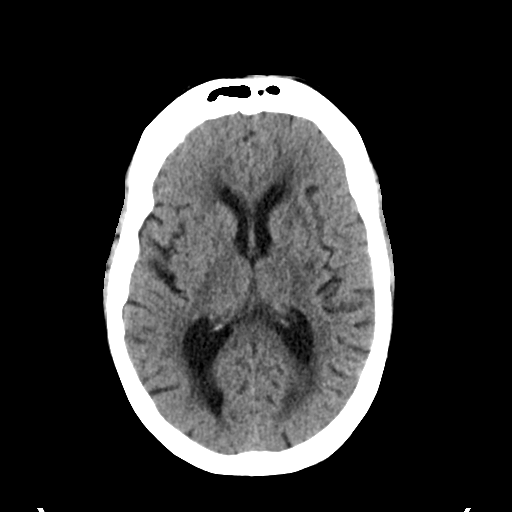
[im 18/33  bone]
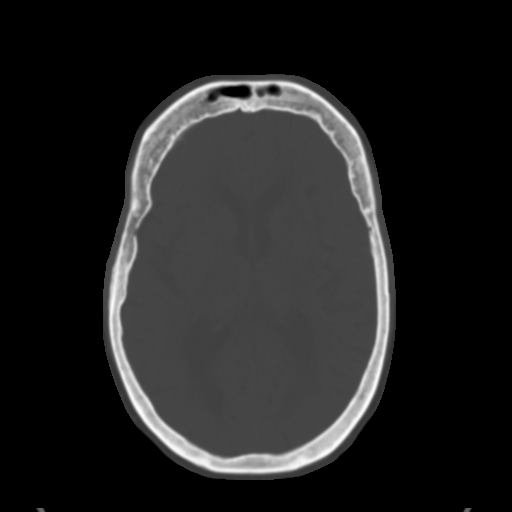
[im 23/33  brain]
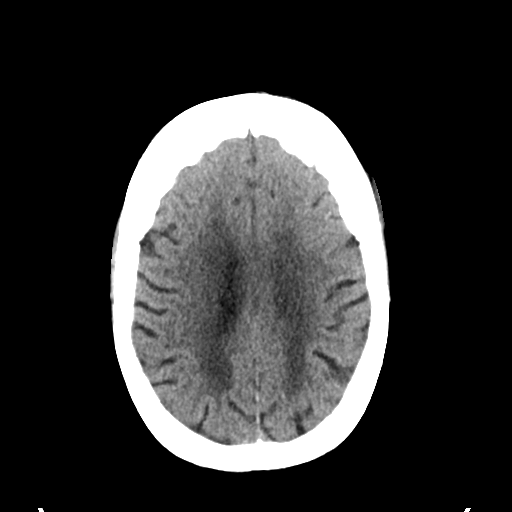
[im 26/33  brain]
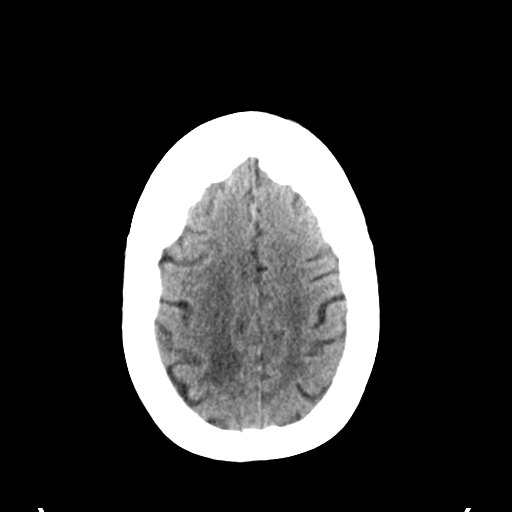
[im 30/33  brain]
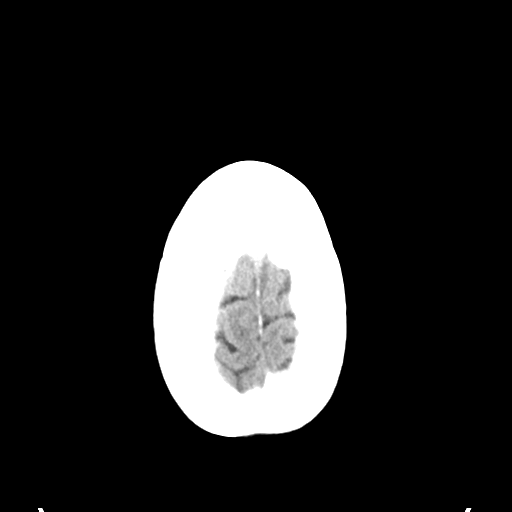

[Series 5: head 3.0 mpr cor · coronal · 0.32mm/px · 3 of 74 slices shown]
[im 25/74  brain]
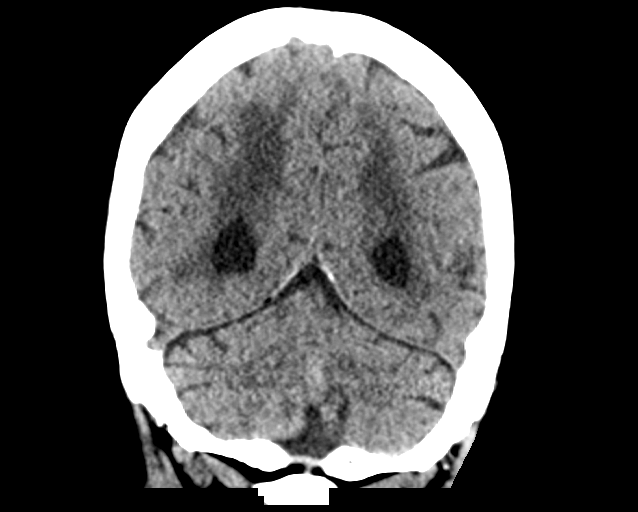
[im 33/74  brain]
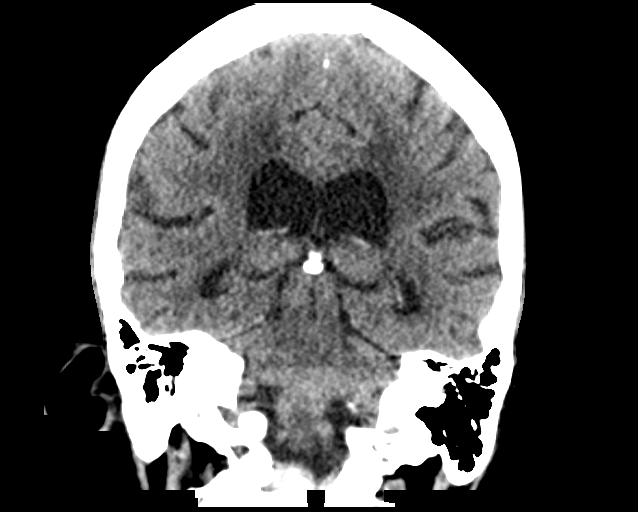
[im 41/74  brain]
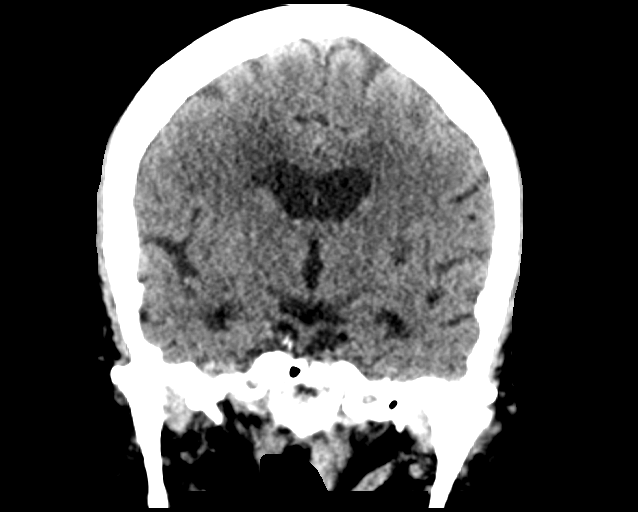

[Series 6: head 3.0 mpr sag · sagittal · 0.32mm/px · 3 of 61 slices shown]
[im 21/61  brain]
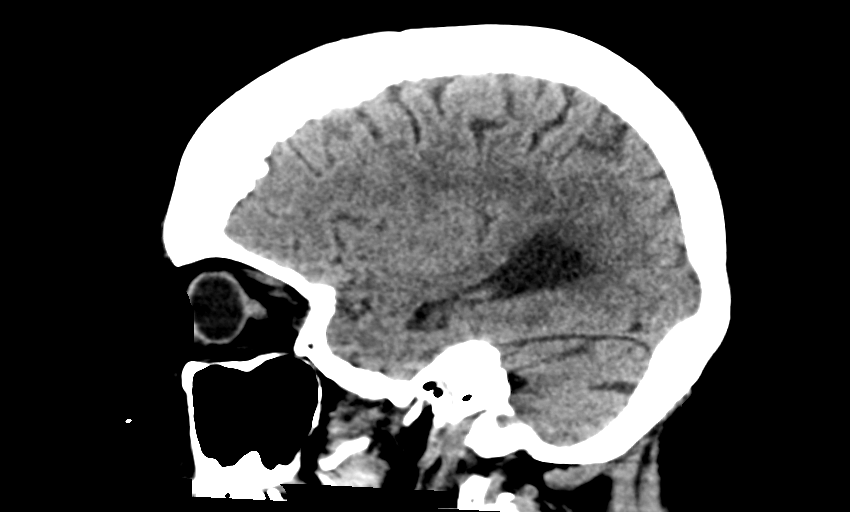
[im 31/61  brain]
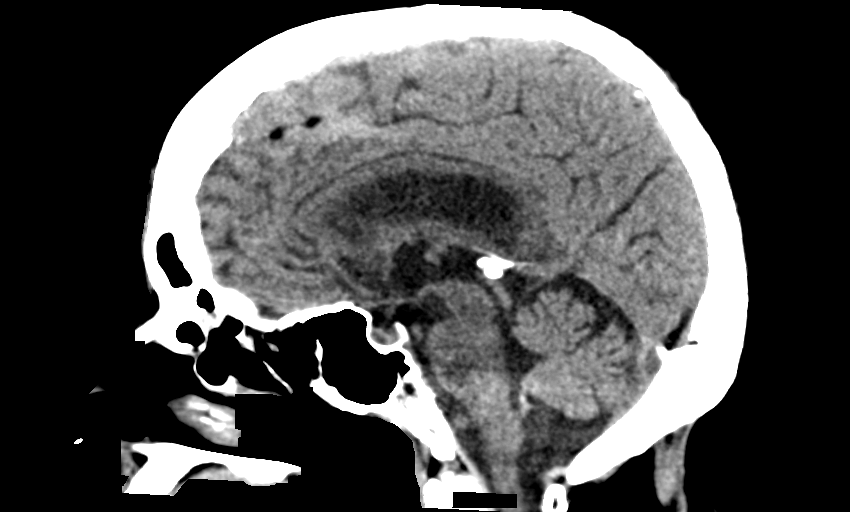
[im 41/61  brain]
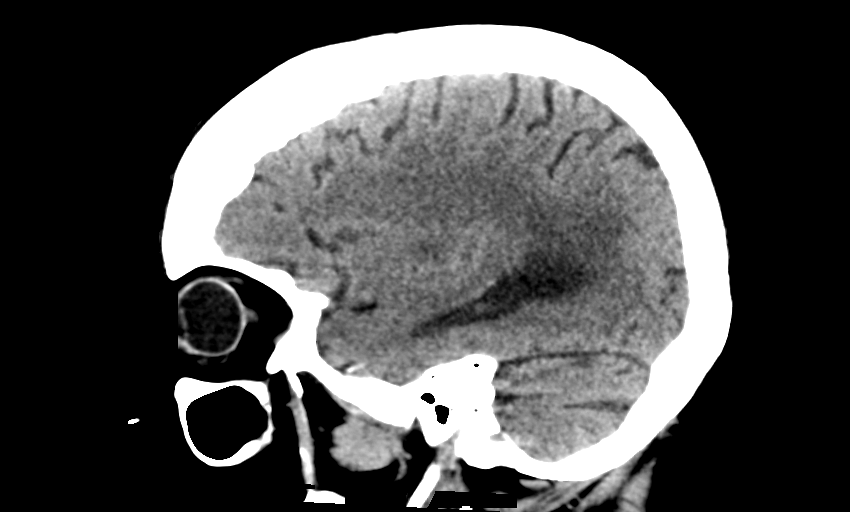

[14 of 47 positions shown; findings below may reference images not displayed]

FINDINGS: Brain: There is mild cerebral atrophy with widening of the
extra-axial spaces and ventricular dilatation.
There are areas of decreased attenuation within the white matter
tracts of the supratentorial brain, consistent with microvascular
disease changes.

Vascular: No hyperdense vessel or unexpected calcification.

Skull: Normal. Negative for fracture or focal lesion.

Sinuses/Orbits: No acute finding.

Other: Mild left frontal parietal scalp soft tissue swelling is
seen.
IMPRESSION: Mild left frontal parietal scalp soft tissue swelling, without
evidence of acute fracture or acute intracranial abnormality.

## 2021-04-07 DEATH — deceased
